# Patient Record
Sex: Male | Born: 1970 | Race: White | Hispanic: No | Marital: Married | Smoking: Former smoker
Health system: Southern US, Community
[De-identification: ages and names within clinical notes are randomized; demographics above are authoritative.]

## PROBLEM LIST (undated history)

## (undated) DIAGNOSIS — K449 Diaphragmatic hernia without obstruction or gangrene: Secondary | ICD-10-CM

## (undated) DIAGNOSIS — D649 Anemia, unspecified: Secondary | ICD-10-CM

## (undated) DIAGNOSIS — K219 Gastro-esophageal reflux disease without esophagitis: Secondary | ICD-10-CM

## (undated) HISTORY — DX: Gastro-esophageal reflux disease without esophagitis: K21.9

## (undated) HISTORY — DX: Anemia, unspecified: D64.9

## (undated) HISTORY — DX: Diaphragmatic hernia without obstruction or gangrene: K44.9

---

## 2001-12-27 ENCOUNTER — Emergency Department (HOSPITAL_COMMUNITY): Admission: EM | Admit: 2001-12-27 | Discharge: 2001-12-27 | Payer: Self-pay | Admitting: *Deleted

## 2003-11-04 ENCOUNTER — Encounter: Admission: RE | Admit: 2003-11-04 | Discharge: 2003-11-04 | Payer: Self-pay | Admitting: Family Medicine

## 2005-10-02 IMAGING — US US ABDOMEN COMPLETE
1 series · 14 of 25 positions shown · non-contrast
Comparison: none

CLINICAL DATA: 33 year old with right upper quadrant abdominal pain.
 ULTRASOUND ABDOMEN, COMPLETE
 Liver is sonographically normal in appearance.  The gallbladder is normal.  The common bile duct measures 2.9 mm, within normal limits.  The IVC and aorta are normal.  Pancreas is fairly well visualized and demonstrates no sonographic abnormalities.  Spleen is mildly enlarged, measuring a maximum of 14 cm.  The right kidney measures 11.6 cm, the left kidney measures 12.4 cm.  No hydronephrosis, renal calculi, or renal masses.  Normal echogenicity.  
 IMPRESSION
 1. Unremarkable ultrasound of abdomen examination except for a mildly enlarged spleen.

[Series 1: unknown · 0.32mm/px · 14 of 83 slices shown]
[im 1/83]
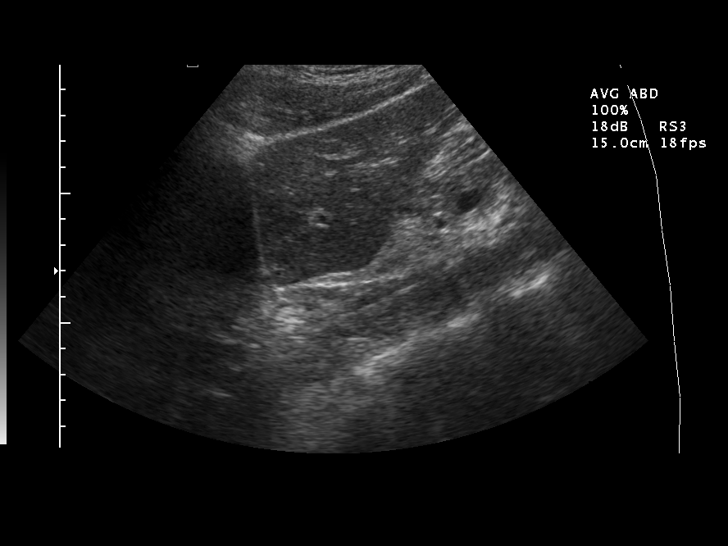
[im 7/83]
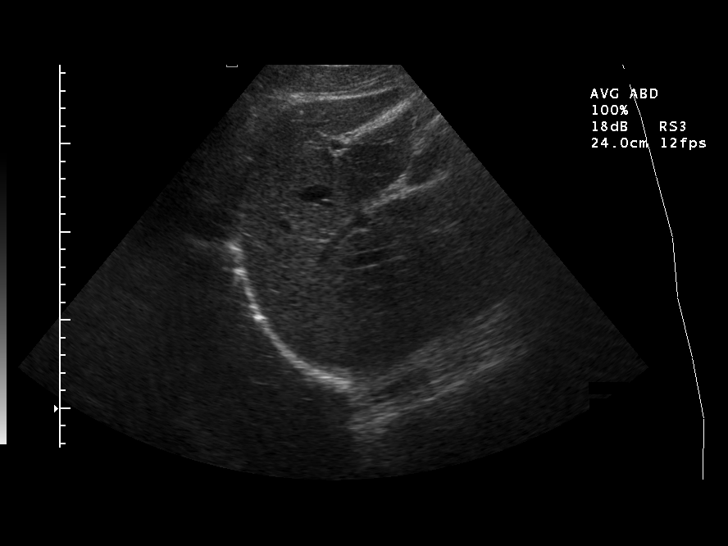
[im 14/83]
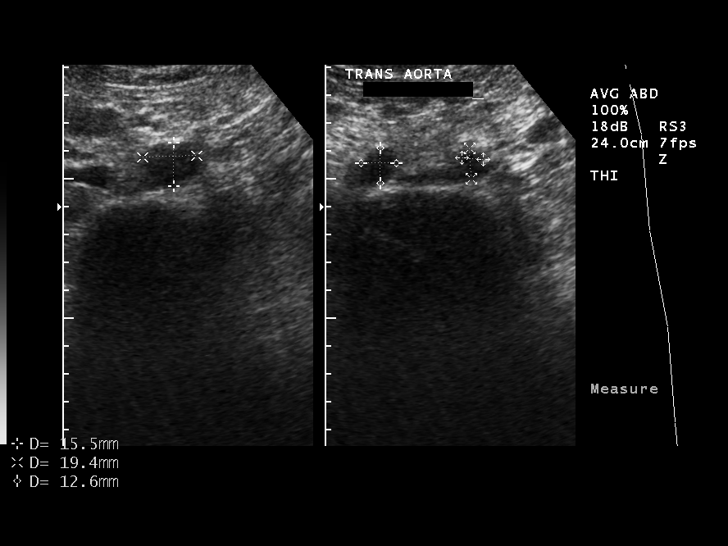
[im 21/83]
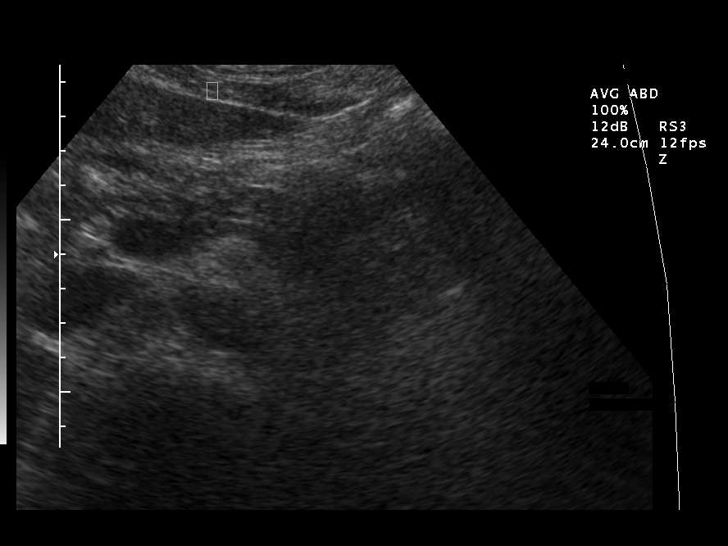
[im 28/83]
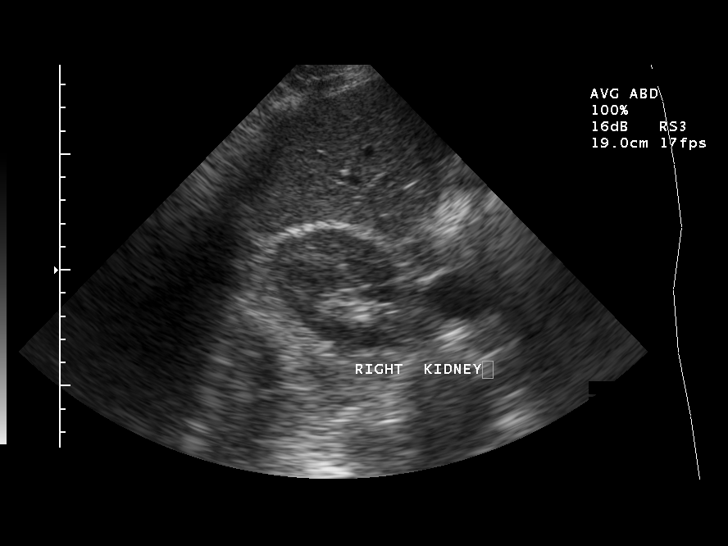
[im 31/83]
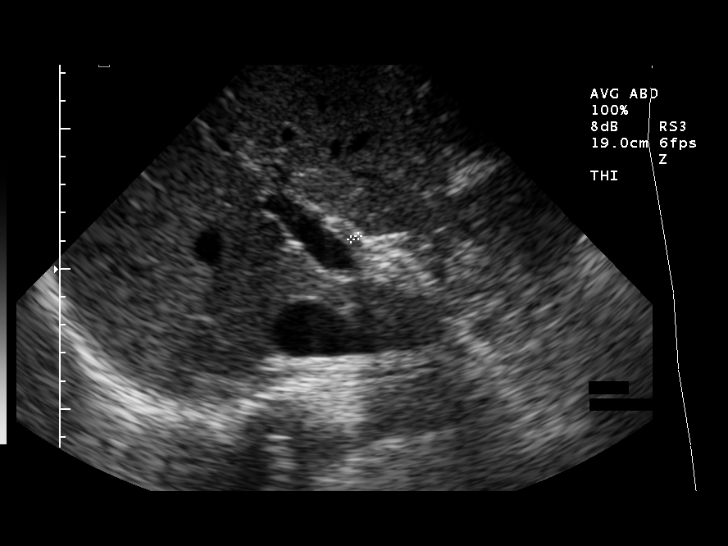
[im 38/83]
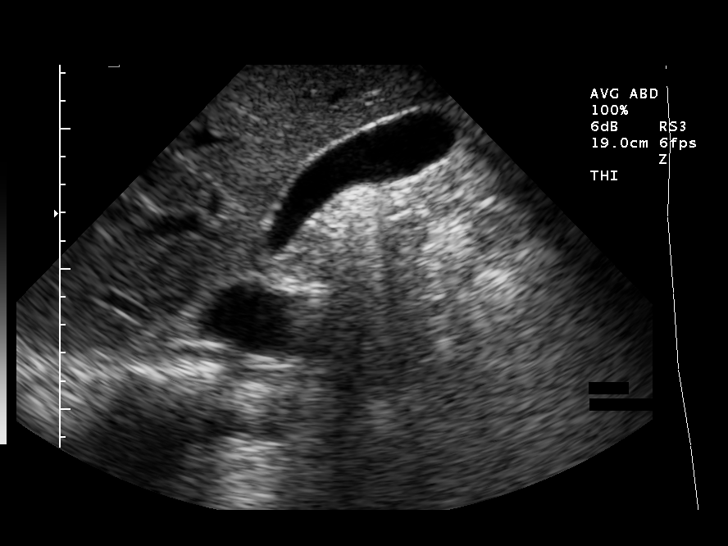
[im 45/83]
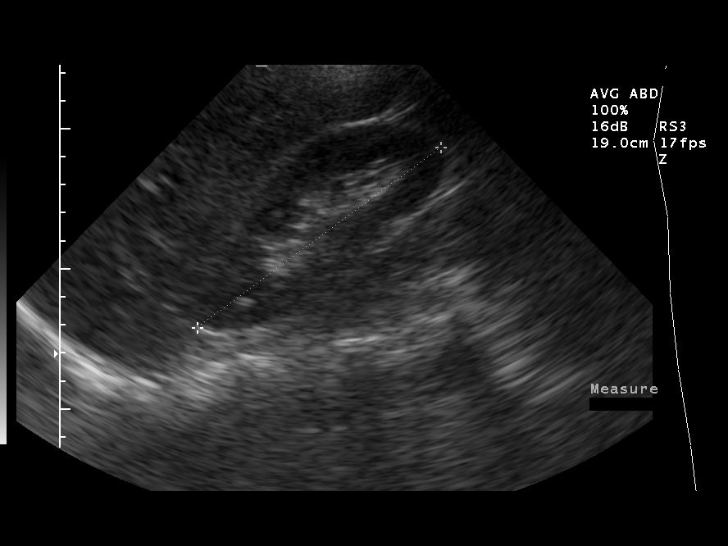
[im 52/83]
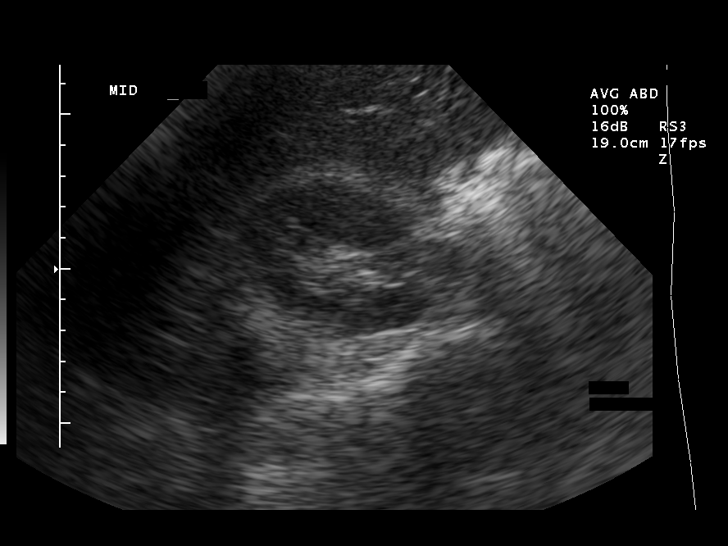
[im 55/83]
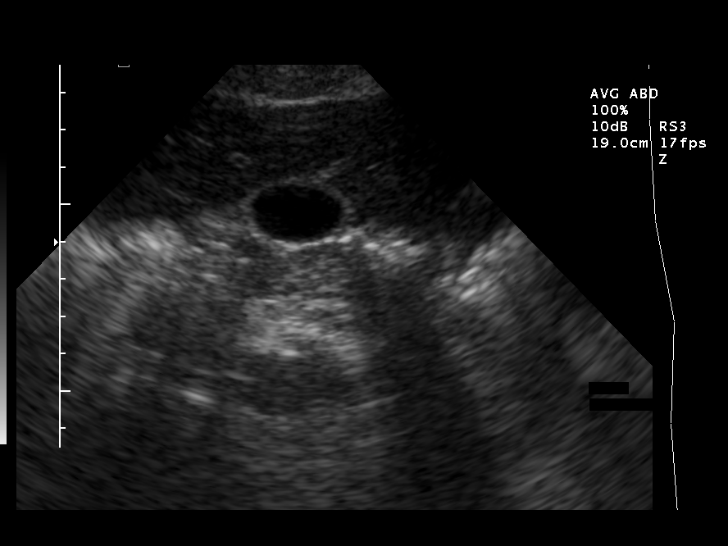
[im 62/83]
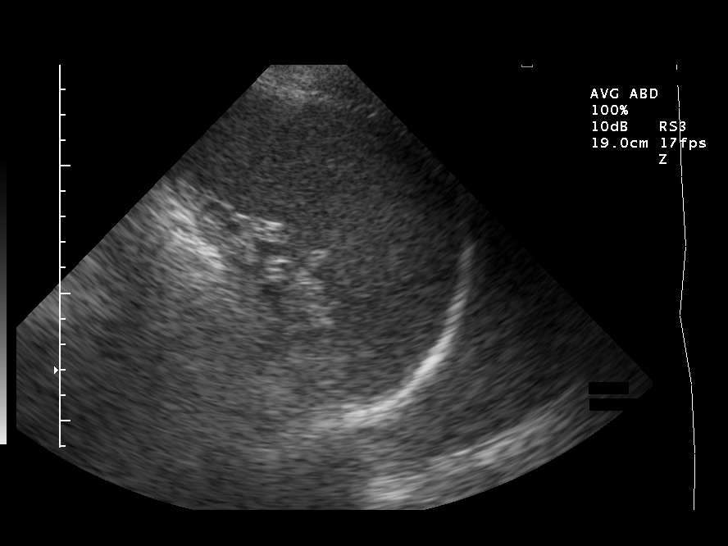
[im 69/83]
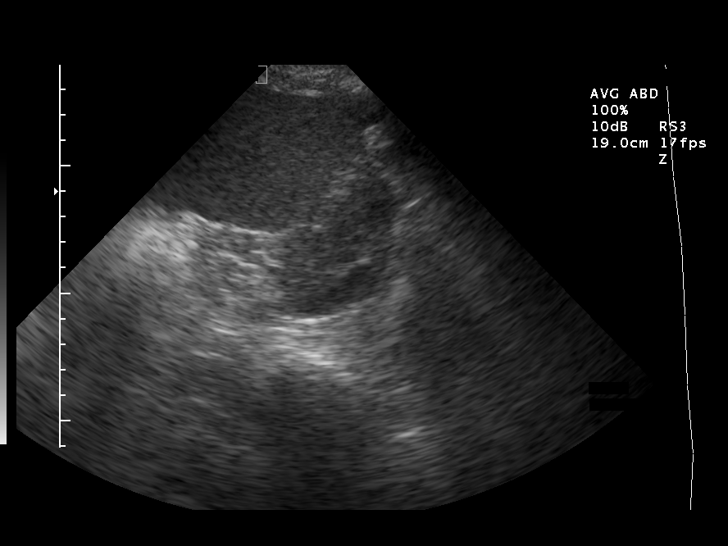
[im 76/83]
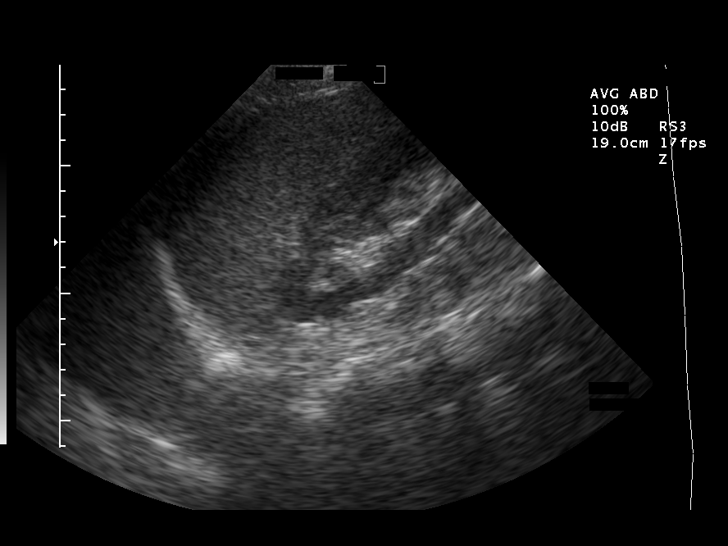
[im 83/83]
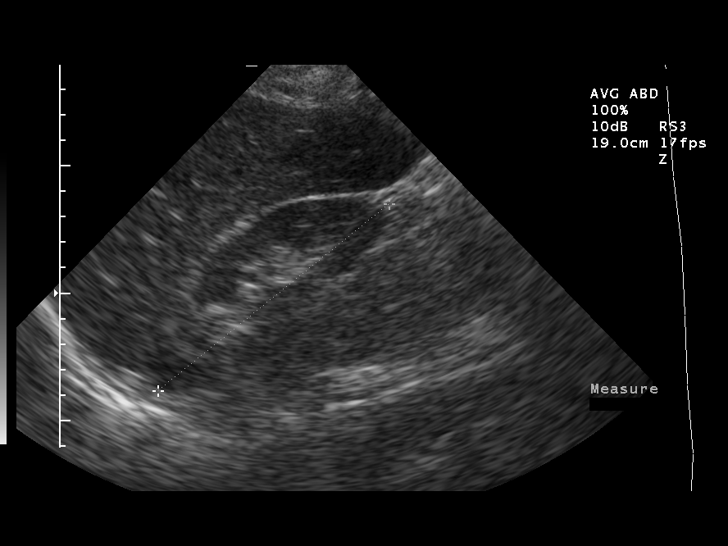

[14 of 25 positions shown; findings below may reference images not displayed]

## 2005-11-13 ENCOUNTER — Ambulatory Visit: Payer: Self-pay | Admitting: Pulmonary Disease

## 2011-05-22 ENCOUNTER — Encounter: Payer: Self-pay | Admitting: Gastroenterology

## 2011-05-22 ENCOUNTER — Telehealth: Payer: Self-pay | Admitting: Internal Medicine

## 2011-05-22 NOTE — Telephone Encounter (Signed)
Spoke with wife, pt schedule with Jarold Motto 07/02/2011

## 2011-05-22 NOTE — Telephone Encounter (Signed)
Dr Jarold Motto will you accept this patient?

## 2011-05-22 NOTE — Telephone Encounter (Signed)
ok 

## 2011-05-22 NOTE — Telephone Encounter (Signed)
Dr. Gessner do you approve of change? 

## 2011-06-25 ENCOUNTER — Encounter: Payer: Self-pay | Admitting: *Deleted

## 2011-07-02 ENCOUNTER — Encounter: Payer: Self-pay | Admitting: Gastroenterology

## 2011-07-02 ENCOUNTER — Other Ambulatory Visit (INDEPENDENT_AMBULATORY_CARE_PROVIDER_SITE_OTHER): Payer: BC Managed Care – PPO

## 2011-07-02 ENCOUNTER — Ambulatory Visit (INDEPENDENT_AMBULATORY_CARE_PROVIDER_SITE_OTHER): Payer: BC Managed Care – PPO | Admitting: Gastroenterology

## 2011-07-02 VITALS — BP 124/72 | HR 80 | Ht 74.0 in | Wt 225.0 lb

## 2011-07-02 DIAGNOSIS — R197 Diarrhea, unspecified: Secondary | ICD-10-CM

## 2011-07-02 DIAGNOSIS — K625 Hemorrhage of anus and rectum: Secondary | ICD-10-CM

## 2011-07-02 DIAGNOSIS — Z8379 Family history of other diseases of the digestive system: Secondary | ICD-10-CM

## 2011-07-02 DIAGNOSIS — K649 Unspecified hemorrhoids: Secondary | ICD-10-CM

## 2011-07-02 LAB — IBC PANEL
Iron: 55 ug/dL (ref 42–165)
Saturation Ratios: 12.8 % — ABNORMAL LOW (ref 20.0–50.0)
Transferrin: 306 mg/dL (ref 212.0–360.0)

## 2011-07-02 LAB — CBC WITH DIFFERENTIAL/PLATELET
Basophils Relative: 0.2 % (ref 0.0–3.0)
Eosinophils Absolute: 0.1 10*3/uL (ref 0.0–0.7)
Eosinophils Relative: 2 % (ref 0.0–5.0)
Hemoglobin: 16.1 g/dL (ref 13.0–17.0)
Lymphocytes Relative: 15.7 % (ref 12.0–46.0)
MCHC: 34.3 g/dL (ref 30.0–36.0)
MCV: 86.9 fl (ref 78.0–100.0)
Monocytes Absolute: 0.5 10*3/uL (ref 0.1–1.0)
Neutro Abs: 5 10*3/uL (ref 1.4–7.7)
RBC: 5.38 Mil/uL (ref 4.22–5.81)
WBC: 6.7 10*3/uL (ref 4.5–10.5)

## 2011-07-02 LAB — VITAMIN B12: Vitamin B-12: 497 pg/mL (ref 211–911)

## 2011-07-02 LAB — TSH: TSH: 1.31 u[IU]/mL (ref 0.35–5.50)

## 2011-07-02 LAB — BASIC METABOLIC PANEL
Calcium: 9.5 mg/dL (ref 8.4–10.5)
Creatinine, Ser: 1.1 mg/dL (ref 0.4–1.5)
GFR: 80.55 mL/min (ref >60.00)
Sodium: 142 mEq/L (ref 135–145)

## 2011-07-02 LAB — HEPATIC FUNCTION PANEL
Bilirubin, Direct: 0.1 mg/dL (ref 0.0–0.3)
Total Bilirubin: 0.4 mg/dL (ref 0.3–1.2)

## 2011-07-02 LAB — SEDIMENTATION RATE: Sed Rate: 10 mm/hr (ref 0–22)

## 2011-07-02 MED ORDER — MOVIPREP 100 G PO SOLR: 1.0000 | Freq: Once | ORAL | Status: DC

## 2011-07-02 NOTE — Patient Instructions (Signed)
Your procedure has been scheduled for 07/05/2011, please follow the seperate instructions.  Your prescription(s) have been sent to you pharmacy.  Please go to the basement today for your labs.  Follow the FODMAP diet given today.

## 2011-07-02 NOTE — Progress Notes (Signed)
History of Present Illness:  This is a very nice 41 year old Caucasian male self-referred for evaluation of 8 months of periodic watery diarrhea with some bright red blood per rectum and some anorectal discomfort. His diarrhea is periodic without any real precipitating element except for greasy foods. He has an extremely positive family history of celiac disease and his mother, brother, and uncle. Apparently he has been evaluated in [facility] with small bowel biopsy that was normal. Recent serologic studies also were apparently normal. The patient denies lactose intolerance, anorexia, weight loss, or any systemic complaints. He does have seasonal allergies with hayfever-type complaints. He has periodic acid reflux but that does not require medications. The patient denies abdominal pain, hepatobiliary or systemic complaints such as fever or chills.  I have reviewed this patient's present history, medical and surgical past history, allergies and medications.     ROS: The remainder of the 10 point ROS is negative     Physical Exam: Blood pressure 124/72, pulse 80 and regular, and BMI 28.89. General well developed well nourished patient in no acute distress, appearing his stated age Eyes PERRLA, no icterus, fundoscopic exam per opthamologist Skin no lesions noted Neck supple, no adenopathy, no thyroid enlargement, no tenderness Chest clear to percussion and auscultation Heart no significant murmurs, gallops or rubs noted Abdomen no hepatosplenomegaly masses or tenderness, BS normal.  Rectal inspection normal no fissures, or fistulae noted.  No masses or tenderness on digital exam. Stool guaiac negative. Posterior external hemorrhoid noted. It is not thrombosed or bleeding. I could not appreciate any other abnormalities on visualization or digital exam. His stool is loose and has a lot of mucus, but no heme. Extremities no acute joint lesions, edema, phlebitis or evidence of cellulitis. Neurologic  patient oriented x 3, cranial nerves intact, no focal neurologic deficits noted. Psychological mental status normal and normal affect.  Assessment and plan: Periodic diarrhea probably related to IBS with resultant hemorrhoidal bleeding. I have decided to check laboratory parameters including celiac profile, also we will proceed with endoscopy and small bowel biopsy, also colonoscopy to exclude left-sided colitis. I have placed him on the  FODMAP for IBS diarrhea which includes some gluten avoidance. The risk and benefits of these procedures have been explained, and he has agreed to proceed as planned. He does not want creams for his hemorrhoids, or medications for GERD  No diagnosis found.

## 2011-07-03 LAB — CELIAC PANEL 10
Endomysial Screen: NEGATIVE
Gliadin IgA: 6.3 U/mL (ref <20)
Gliadin IgG: 23 U/mL — ABNORMAL HIGH (ref <20)
Tissue Transglut Ab: 345 U/mL — ABNORMAL HIGH (ref <20)
Tissue Transglutaminase Ab, IgA: 3.9 U/mL (ref <20)

## 2011-07-05 ENCOUNTER — Ambulatory Visit (AMBULATORY_SURGERY_CENTER): Payer: BC Managed Care – PPO | Admitting: Gastroenterology

## 2011-07-05 ENCOUNTER — Encounter: Payer: Self-pay | Admitting: Gastroenterology

## 2011-07-05 ENCOUNTER — Other Ambulatory Visit: Payer: Self-pay | Admitting: Gastroenterology

## 2011-07-05 VITALS — BP 129/70 | HR 61 | Temp 97.5°F | Resp 20 | Ht 74.0 in | Wt 225.0 lb

## 2011-07-05 DIAGNOSIS — K649 Unspecified hemorrhoids: Secondary | ICD-10-CM

## 2011-07-05 DIAGNOSIS — K625 Hemorrhage of anus and rectum: Secondary | ICD-10-CM

## 2011-07-05 DIAGNOSIS — K648 Other hemorrhoids: Secondary | ICD-10-CM

## 2011-07-05 DIAGNOSIS — Z8379 Family history of other diseases of the digestive system: Secondary | ICD-10-CM

## 2011-07-05 DIAGNOSIS — K219 Gastro-esophageal reflux disease without esophagitis: Secondary | ICD-10-CM

## 2011-07-05 DIAGNOSIS — D1330 Benign neoplasm of unspecified part of small intestine: Secondary | ICD-10-CM

## 2011-07-05 DIAGNOSIS — R197 Diarrhea, unspecified: Secondary | ICD-10-CM

## 2011-07-05 DIAGNOSIS — K449 Diaphragmatic hernia without obstruction or gangrene: Secondary | ICD-10-CM

## 2011-07-05 MED ORDER — HYDROCORTISONE ACETATE 25 MG RE SUPP: 25.0000 mg | Freq: Every day | RECTAL | Status: DC

## 2011-07-05 MED ORDER — SODIUM CHLORIDE 0.9 % IV SOLN: 500.0000 mL | INTRAVENOUS | Status: DC

## 2011-07-05 NOTE — Patient Instructions (Addendum)

## 2011-07-05 NOTE — Op Note (Signed)
[facility] [address] [place], [place]  [address]  COLONOSCOPY PROCEDURE REPORT  PATIENT:  Eric Lozano, Eric Lozano  MR#:  [id number] BIRTHDATE:  06-Aug-1970, 40 yrs. old  GENDER:  male ENDOSCOPIST:  Vania Rea. Jarold Motto, MD, [facility] REF. BY: PROCEDURE DATE:  07/05/2011 PROCEDURE:  Colonoscopy 87564 ASA CLASS:  Class I INDICATIONS:  rectal bleeding MEDICATIONS:   propofol (Diprivan) 250 mg IV  DESCRIPTION OF PROCEDURE:   After the risks and benefits and of the procedure were explained, informed consent was obtained. Digital rectal exam was performed and revealed no abnormalities. The LB CF-H180AL [id number] endoscope was introduced through the anus and advanced to the cecum, which was identified by both the appendix and ileocecal valve.  The quality of the prep was excellent, using MoviPrep.  The instrument was then slowly withdrawn as the colon was fully examined. <<PROCEDUREIMAGES>>  FINDINGS:  No polyps or cancers were seen.  This was otherwise a normal examination of the colon.  Internal Hemorrhoids were found. Retroflexed views in the rectum revealed internal hemorrhoids. SEE PICTURES  The scope was then withdrawn from the patient and the procedure completed.  COMPLICATIONS:  None ENDOSCOPIC IMPRESSION: 1) No polyps or cancers 2) Otherwise normal examination 3) Internal hemorrhoids RECOMMENDATIONS: 1) Continue current colorectal screening recommendations for "routine risk" patients with a repeat colonoscopy in 10 years. PRN HEMORRHOID CARE.  REPEAT EXAM:  No  ______________________________ Vania Rea. Jarold Motto, MD, [facility]  CC:  n. eSIGNED:   Vania Rea. Eric Lozano at 07/05/2011 02:28 PM  Corliss Marcus, [phone]

## 2011-07-05 NOTE — Op Note (Signed)
[facility] [address] [place], [place]  [address]  ENDOSCOPY PROCEDURE REPORT  PATIENT:  Eric Lozano, Eric Lozano  MR#:  [id number] BIRTHDATE:  December 04, 1970, 40 yrs. old  GENDER:  male  ENDOSCOPIST:  Vania Rea. Jarold Motto, MD, [facility] Referred by:  PROCEDURE DATE:  07/05/2011 PROCEDURE:  EGD with biopsy, 19147 ASA CLASS:  Class I INDICATIONS:  GERD GLUTEN INTOLERANCE.++ FH CELIAC DISEASE.  MEDICATIONS:   There was residual sedation effect present from prior procedure., propofol (Diprivan) 150 mg IV TOPICAL ANESTHETIC:  DESCRIPTION OF PROCEDURE:   After the risks and benefits of the procedure were explained, informed consent was obtained.  The LB GIF-H180 [id number] endoscope was introduced through the mouth and advanced to the second portion of the duodenum.  The instrument was slowly withdrawn as the mucosa was fully examined. <<PROCEDUREIMAGES>>  A hiatal hernia was found. 5 CM PROLAPSING HIATIAL HERNIA NOTED AND FREE REFLUX.SEE PICTURES.  Normal duodenal folds were noted. SI BIOPSIES DONE.  The esophagus and gastroesophageal junction were completely normal in appearance.    Retroflexed views revealed a hiatal hernia.    The scope was then withdrawn from the patient and the procedure completed.  COMPLICATIONS:  None  ENDOSCOPIC IMPRESSION: 1) Hiatal hernia 2) A hiatal hernia 1.CHRONIC GERD AND HIATIAL HERNIA 2.R/O CELIAC MUCOSAL CHANGES RECOMMENDATIONS: 1) Await biopsy results TRIAL OF DEXILANT 60 MG/QAM SAMPLES.  ______________________________ Vania Rea. Jarold Motto, MD, [facility]  CC:  n. eSIGNED:   Vania Rea. Jadea Shiffer at 07/05/2011 02:36 PM  Corliss Marcus, [phone]

## 2011-07-08 ENCOUNTER — Telehealth: Payer: Self-pay | Admitting: *Deleted

## 2011-07-08 NOTE — Telephone Encounter (Signed)
  Follow up Call-  Call back number 07/05/2011  Post procedure Call Back phone  # [phone]  Permission to leave phone message Yes     Patient questions:  Do you have a fever, pain , or abdominal swelling? no Pain Score  0 *  Have you tolerated food without any problems? yes  Have you been able to return to your normal activities? yes  Do you have any questions about your discharge instructions: Diet   no Medications  no Follow up visit  no  Do you have questions or concerns about your Care? no  Actions: * If pain score is 4 or above: No action needed, pain <4.

## 2011-07-12 ENCOUNTER — Encounter: Payer: Self-pay | Admitting: Gastroenterology

## 2011-07-31 ENCOUNTER — Encounter: Payer: Self-pay | Admitting: *Deleted

## 2011-08-06 ENCOUNTER — Ambulatory Visit: Payer: BC Managed Care – PPO | Admitting: Gastroenterology

## 2012-04-28 ENCOUNTER — Ambulatory Visit (INDEPENDENT_AMBULATORY_CARE_PROVIDER_SITE_OTHER): Payer: BC Managed Care – PPO | Admitting: Gastroenterology

## 2012-04-28 ENCOUNTER — Encounter: Payer: Self-pay | Admitting: Gastroenterology

## 2012-04-28 ENCOUNTER — Other Ambulatory Visit (INDEPENDENT_AMBULATORY_CARE_PROVIDER_SITE_OTHER): Payer: BC Managed Care – PPO

## 2012-04-28 VITALS — BP 100/60 | HR 67 | Ht 73.5 in | Wt 211.1 lb

## 2012-04-28 DIAGNOSIS — K900 Celiac disease: Secondary | ICD-10-CM

## 2012-04-28 DIAGNOSIS — D649 Anemia, unspecified: Secondary | ICD-10-CM

## 2012-04-28 DIAGNOSIS — K648 Other hemorrhoids: Secondary | ICD-10-CM

## 2012-04-28 DIAGNOSIS — R110 Nausea: Secondary | ICD-10-CM

## 2012-04-28 DIAGNOSIS — E739 Lactose intolerance, unspecified: Secondary | ICD-10-CM

## 2012-04-28 DIAGNOSIS — K9041 Non-celiac gluten sensitivity: Secondary | ICD-10-CM

## 2012-04-28 DIAGNOSIS — K625 Hemorrhage of anus and rectum: Secondary | ICD-10-CM

## 2012-04-28 LAB — FOLATE: Folate: 18.6 ng/mL (ref >5.9)

## 2012-04-28 LAB — IBC PANEL: Saturation Ratios: 34.7 % (ref 20.0–50.0)

## 2012-04-28 LAB — FERRITIN: Ferritin: 19.5 ng/mL — ABNORMAL LOW (ref 22.0–322.0)

## 2012-04-28 MED ORDER — HYDROCORTISONE ACETATE 25 MG RE SUPP: RECTAL | Status: AC

## 2012-04-28 NOTE — Progress Notes (Signed)
History of Present Illness:  This is a 42 year old Caucasian male who presents with one-week history of intermittent bright red blood per rectum described originally as maroonish stool.  He had negative colonoscopy in April 2013, and also had a negative endoscopy except for a 5 cm hiatal hernia.  The patient denies acid reflux symptoms at this time.  Prominent internal hemorrhoids were noted the time of his colonoscopy.  Patient's had no abdominal pain associated with his rectal bleeding, but he does perform extreme Frisbee exercise.  He denies current melena, hematochezia, abdominal pain or other GI or systemic complaints.  He does have celiac disease and avoids is a gluten-free diet.  However, previous small bowel biopsy did not confirm actual celiac disease in terms of pathologic changes.  He also has lactose intolerance and avoids major lactose products.  Despite these complaints, he is maintained a normal weight and generally feels good.  He denies upper gastrointestinal or hepatobiliary complaints.  Famikyhistory is noncontributory.  I have reviewed this patient's present history, medical and surgical past history, allergies and medications.     ROS:   All systems were reviewed and are negative unless otherwise stated in the HPI.    Physical Exam: At pressure over 60, pulse 67 and regular, and weight 211 pounds the BMI of 27.48. General well developed well nourished patient in no acute distress, appearing their stated age Eyes PERRLA, no icterus, fundoscopic exam per opthamologist Skin no lesions noted Neck supple, no adenopathy, no thyroid enlargement, no tenderness Chest clear to percussion and auscultation Heart no significant murmurs, gallops or rubs noted Abdomen no hepatosplenomegaly masses or tenderness, BS normal.  Rectal inspection normal no fissures, or fistulae noted.  No masses or tenderness on digital exam. Stool guaiac negative. Extremities no acute joint lesions, edema,  phlebitis or evidence of cellulitis. Neurologic patient oriented x 3, cranial nerves intact, no focal neurologic deficits noted. Psychological mental status normal and normal affect.  ANOSCOPY: Endoscopic exams completed without difficulty.  There is a large posterior hemorrhoid with some fresh heme noted.  Exam of the rectum otherwise unremarkable.  There is no evidence of fissures or fistulae.  Rectal exam showed no masses, tenderness, fluctuance.  Assessment and plan: Probable internal hemorrhoidal bleeding versus episode of ischemic colitis which is resolved related to his extreme Frisbee exercise.  For now, we will treat with Anusol-HC suppositories at bedtime with progress report in 2 weeks.  He still having problems we will proceed with endoscopic management of his hemorrhoids with either sclerosing or rubber band ligation.  He is to follow his gluten-free diet is tolerated.  We'll repeat today his celiac profile.  He does have relatives with documented celiac disease.  I've asked him to avoid NSAIDs if at all possible Encounter Diagnoses  Name Primary?  . Nausea Yes  . Anemia

## 2012-04-28 NOTE — Patient Instructions (Addendum)
We have sent the following medications to your pharmacy for you to pick up at your convenience: Anusol Suppositories, please use at bedtime for two weeks.  Your physician has requested that you go to the basement for the following lab work before leaving today: Anemia Panel and Celiac  _____________________________________________________________________________________________________________________  Hemorrhoids Hemorrhoids are veins in the rectum that get big. These veins can get blocked. Blocked veins become puffy (swollen) and painful. HOME CARE  Eat more fiber.  Drink enough fluid to keep your pee (urine) clear or pale yellow.  Exercise often.  Avoid straining to poop (bowel movement).  Keep the butt area dry and clean.  Only take medicine as told by your doctor. If your hemorrhoids are puffy and painful:  Take a warm bath for 20 to 30 minutes. Do this 3 to 4 times a day.  Place ice packs on the area. Use the ice packs between the baths.  Put ice in a plastic bag.  Place a towel between your skin and the bag.  Leave the ice on for 15 to 20 minutes, 3 to 4 times a day.  Do not use a donut-shaped pillow. Do not sit on the toilet for a long time.  Go to the bathroom when your body has the urge to poop. This is so you do not strain as much to poop. GET HELP RIGHT AWAY IF:   You have increasing pain that is not controlled with medicine.  You have uncontrolled bleeding.  You cannot poop.  You have pain or puffiness outside the area of the hemorrhoids.  You have chills.  You have a temperature by mouth above 102 F (38.9 C), not controlled by medicine. MAKE SURE YOU:   Understand these instructions.  Will watch your condition.  Will get help right away if you are not doing well or get worse. Document Released: 12/26/2007 Document Revised: 06/10/2011 Document Reviewed: 12/26/2007 [facility] Patient Information 2013 [place], [place].

## 2012-04-29 LAB — CELIAC PANEL 10
Gliadin IgA: 5.3 U/mL (ref <20)
Gliadin IgG: 18.5 U/mL (ref <20)
Tissue Transglutaminase Ab, IgA: 3.6 U/mL (ref <20)

## 2013-05-19 ENCOUNTER — Telehealth: Payer: Self-pay | Admitting: Gastroenterology

## 2013-05-19 NOTE — Telephone Encounter (Signed)
Spoke with patient and he states he would like a letter written for tax purposes stating he has celiac disease. Please, advise if this is ok.

## 2013-05-20 ENCOUNTER — Encounter: Payer: Self-pay | Admitting: *Deleted

## 2013-05-20 NOTE — Telephone Encounter (Signed)
I have never seen this pt-will have to review record

## 2013-05-20 NOTE — Telephone Encounter (Signed)
I think it is fine to write a short letter  Stating pt carries a dx of celiac disease, and had been evaluated by Dr. Sharlett Iles- forward to [facility] box please.

## 2013-05-20 NOTE — Telephone Encounter (Signed)
Letter mailed to patient. Patient aware.

## 2017-08-27 NOTE — Progress Notes (Signed)
[facility] [address] [place], [place] [address] Phone: [phone] Subjective:    I'm seeing this patient by the request  of:  Donald Prose, MD   CC: foot pain   [id number]  Eric Lozano is a 47 y.o. male coming in with complaint of foot pain. Bilateral achilles pain. Right is worse. Wakes him up at around 2am. States he has to sleep on his back. Has been stretching but it doesn't help. Feels good when he's active. Most pain is at night. Stiff in the morning. Has insoles in his shoes for his achilles.   Onset- Chronic Location- Achilles  Duration- Night is worse  Character- Sharp Aggravating factors- Sleeping on his side Reliving factors-  Therapies tried- Ice, heat Severity-7 out of 10     Past Medical History:  Diagnosis Date  . Anemia    As a child   . GERD (gastroesophageal reflux disease)   . Hemorrhoids   . Hiatal hernia    No past surgical history on file. Social History   Socioeconomic History  . Marital status: Single    Spouse name: Not on file  . Number of children: 3  . Years of education: Not on file  . Highest education level: Not on file  Occupational History  . Occupation: Product manager: [employer]  . Financial resource strain: Not on file  . Food insecurity:    Worry: Not on file    Inability: Not on file  . Transportation needs:    Medical: Not on file    Non-medical: Not on file  Tobacco Use  . Smoking status: Former Research scientist (life sciences)  . Smokeless tobacco: Never Used  Substance and Sexual Activity  . Alcohol use: Yes    Comment: Occasional   . Drug use: No  . Sexual activity: Not on file  Lifestyle  . Physical activity:    Days per week: Not on file    Minutes per session: Not on file  . Stress: Not on file  Relationships  . Social connections:    Talks on phone: Not on file    Gets together: Not on file    Attends religious service: Not on file    Active member of  club or organization: Not on file    Attends meetings of clubs or organizations: Not on file    Relationship status: Not on file  Other Topics Concern  . Not on file  Social History Narrative  . Not on file   No Known Allergies Family History  Problem Relation Age of Onset  . Celiac disease Mother   . Celiac disease Brother   . Colon cancer Neg Hx   . Leukemia Cousin   . Lymphoma Cousin   . Addison's disease Brother   . Throat cancer Father        ? Esophagus Cancer as well   . Diabetes Maternal Uncle   . Diabetes Paternal Grandfather      Past medical history, social, surgical and family history all reviewed in electronic medical record.  No pertanent information unless stated regarding to the chief complaint.   Review of Systems:Review of systems updated and as accurate as of 08/27/17  No headache, visual changes, nausea, vomiting, diarrhea, constipation, dizziness, abdominal pain, skin rash, fevers, chills, night sweats, weight loss, swollen lymph nodes, body aches, joint swelling, muscle aches, chest pain, shortness of breath, mood changes.   Objective  There  were no vitals taken for this visit. Systems examined below as of 08/27/17   General: No apparent distress alert and oriented x3 mood and affect normal, dressed appropriately.  HEENT: Pupils equal, extraocular movements intact  Respiratory: Patient's speak in full sentences and does not appear short of breath  Cardiovascular: No lower extremity edema, non tender, no erythema  Skin: Warm dry intact with no signs of infection or rash on extremities or on axial skeleton.  Abdomen: Soft nontender  Neuro: Cranial nerves II through XII are intact, neurovascularly intact in all extremities with 2+ DTRs and 2+ pulses.  Lymph: No lymphadenopathy of posterior or anterior cervical chain or axillae bilaterally.  Gait normal with good balance and coordination.  MSK:  Non tender with full range of motion and good stability and  symmetric strength and tone of shoulders, elbows, wrist, hip, knee bilaterally.  Ankle: Bilateral No visible erythema or swelling. Range of motion is full in all directions. Strength is 5/5 in all directions. Stable lateral and medial ligaments; squeeze test and kleiger test unremarkable; Talar dome nontender; No pain at base of 5th MT; No tenderness over cuboid; No tenderness over N spot or navicular prominence No tenderness on posterior aspects of lateral and medial malleolus No sign of peroneal tendon subluxations or tenderness to palpation Tightness of the Achilles at the insertion. Negative tarsal tunnel tinel's Able to walk 4 steps.  MSK US performed of: Bilateral ankle This study was ordered, performed, and interpreted by Charlann Boxer D.O.  Foot/Ankle:   Patient does have some calcific changes at the insertion of the Achilles.  No hypoechoic changes on ultrasound.  IMPRESSION: Calcific Achilles  [address]7110; 15 additional minutes spent for Therapeutic exercises as stated in above notes.  This included exercises focusing on stretching, strengthening, with significant focus on eccentric aspects.   Long term goals include an improvement in range of motion, strength, endurance as well as avoiding reinjury. Patient's frequency would include in 1-2 times a day, 3-5 times a week for a duration of 6-12 weeks. Ankle strengthening that included:  Basic range of motion exercises to allow proper full motion at ankle Stretching of the lower leg and hamstrings  Theraband exercises for the lower leg - inversion, eversion, dorsiflexion and plantarflexion each to be completed with a theraband Balance exercises to increase proprioception Weight bearing exercises to increase strength and balance  Proper technique shown and discussed handout in great detail with ATC.  All questions were discussed and answered.     Impression and Recommendations:     This case required medical decision making of  moderate complexity.      Note: This dictation was prepared with Dragon dictation along with smaller phrase technology. Any transcriptional errors that result from this process are unintentional.

## 2017-08-28 ENCOUNTER — Encounter: Payer: Self-pay | Admitting: Family Medicine

## 2017-08-28 ENCOUNTER — Ambulatory Visit: Payer: BC Managed Care – PPO | Admitting: Family Medicine

## 2017-08-28 VITALS — BP 102/82 | HR 62 | Ht 73.0 in | Wt 217.0 lb

## 2017-08-28 DIAGNOSIS — M6528 Calcific tendinitis, other site: Secondary | ICD-10-CM

## 2017-08-28 DIAGNOSIS — M7660 Achilles tendinitis, unspecified leg: Secondary | ICD-10-CM | POA: Diagnosis not present

## 2017-08-28 MED ORDER — VITAMIN D (ERGOCALCIFEROL) 1.25 MG (50000 UNIT) PO CAPS: 50000.0000 [IU] | ORAL_CAPSULE | ORAL | 0 refills | Status: DC

## 2017-08-28 MED ORDER — DICLOFENAC SODIUM 2 % TD SOLN: 2.0000 g | Freq: Two times a day (BID) | TRANSDERMAL | 3 refills | Status: AC

## 2017-08-28 NOTE — Patient Instructions (Signed)
Good to see you.  Ice 20 minutes 2 times daily. Usually after activity and before bed. Heel lift ([email or web address]) pennsaid pinkie amount topically 2 times daily as needed.  Exercises 3 times a week.  Once weekly vitamin D for 12 weeks.  Iron 65mg  daily with 500mg  of vitamin C or just the vitamin C with iron containing meals Tart cherry extract any dose at night See me again in 3-4 weeks and if not better lets consider labs

## 2017-08-28 NOTE — Assessment & Plan Note (Signed)
Renal tightness noted.  Possible low vitamin D secondary to stated.  Home exercises given.  Discussed heel lift, home exercises.  Patient will follow-up again in 4 weeks

## 2018-08-19 NOTE — Progress Notes (Signed)
[facility] [address] [place], [place] [address] Phone: [phone] Subjective:   Eric Lozano, am serving as a scribe for Dr. Hulan Saas.   CC: Left shoulder pain  [id number]  Eric Lozano is a 48 y.o. male coming in with complaint of L shoulder pain x 4 months.  Pt has been having nagging L shoulder pain off and on pain and did some self-rehab but flared it up again when attempting to block a shot while playing basketball.  Pt states that his L shoulder "gave out."  Ever since that episode, pt states that he has pain with overhead activity to include basketball free throws and doing a "hammer" throw while playing frisbee.  Pt states that laying on his L side is painful and that he can't sleep on that side due to pain.  Functional IR is also problematic.  Pt denies any mechanical symptoms in his L shoulder.  Onset- 4 months Location- L shoulder, superior aspect Duration-  Character- nagging pain Aggravating factors- overhead ROM and activity to include shooting basketball free throws and doing a hammer throw while playing frisbee. Reliving factors-  Therapies tried-  Severity-sometimes 8 out of 10  January he was playing basketball when another player jammed his shoulder into the joint with flexion. Did not feel a pop but did notice pain immediatly. Patient has had pain with [employer]. Unable to sleep on that side. Denies any radiating symptoms. Has pain with hammer throw with ultimate frisbee.   Past Medical History:  Diagnosis Date  . Anemia    As a child   . GERD (gastroesophageal reflux disease)   . Hemorrhoids   . Hiatal hernia    Lozano past surgical history on file. Social History   Socioeconomic History  . Marital status: Single    Spouse name: Not on file  . Number of children: 3  . Years of education: Not on file  . Highest education level: Not on file  Occupational History  . Occupation: Product manager:  [employer]  . Financial resource strain: Not on file  . Food insecurity:    Worry: Not on file    Inability: Not on file  . Transportation needs:    Medical: Not on file    Non-medical: Not on file  Tobacco Use  . Smoking status: Former Research scientist (life sciences)  . Smokeless tobacco: Never Used  Substance and Sexual Activity  . Alcohol use: Yes    Comment: Occasional   . Drug use: Lozano  . Sexual activity: Not on file  Lifestyle  . Physical activity:    Days per week: Not on file    Minutes per session: Not on file  . Stress: Not on file  Relationships  . Social connections:    Talks on phone: Not on file    Gets together: Not on file    Attends religious service: Not on file    Active member of club or organization: Not on file    Attends meetings of clubs or organizations: Not on file    Relationship status: Not on file  Other Topics Concern  . Not on file  Social History Narrative  . Not on file   Lozano Known Allergies Family History  Problem Relation Age of Onset  . Celiac disease Mother   . Celiac disease Brother   . Colon cancer Neg Hx   . Leukemia Cousin   . Lymphoma  Cousin   . Addison's disease Brother   . Throat cancer Father        ? Esophagus Cancer as well   . Diabetes Maternal Uncle   . Diabetes Paternal Grandfather      Current Outpatient Medications (Cardiovascular):  .  nitroGLYCERIN (NITRODUR - DOSED IN MG/24 HR) 0.2 mg/hr patch, 1/4 patch daily     Current Outpatient Medications (Other):  Marland Kitchen  Diclofenac Sodium (PENNSAID) 2 % SOLN, Place 2 g onto the skin 2 (two) times daily. .  hydrocortisone (ANUSOL-HC) 25 MG suppository, Please place one suppositories rectally at bedtime for two weeks .  Multiple Vitamins-Minerals (MENS MULTIVITAMIN PLUS PO), Take 1 tablet by mouth daily. .  Vitamin D, Ergocalciferol, (DRISDOL) 1.25 MG (50000 UT) CAPS capsule, Take 1 capsule (50,000 Units total) by mouth every 7 (seven) days. .  Vitamin D,  Ergocalciferol, (DRISDOL) 1.25 MG (50000 UT) CAPS capsule, Take 1 capsule (50,000 Units total) by mouth every 7 (seven) days.    Past medical history, social, surgical and family history all reviewed in electronic medical record.  Lozano pertanent information unless stated regarding to the chief complaint.   Review of Systems:  Lozano headache, visual changes, nausea, vomiting, diarrhea, constipation, dizziness, abdominal pain, skin rash, fevers, chills, night sweats, weight loss, swollen lymph nodes, body aches, joint swelling,  chest pain, shortness of breath, mood changes.  Positive muscle aches  Objective  Blood pressure 128/88, pulse 67, height 6\' 1"  (1.854 m), weight 217 lb (98.4 kg), SpO2 99 %.    General: Lozano apparent distress alert and oriented x3 mood and affect normal, dressed appropriately.  HEENT: Pupils equal, extraocular movements intact  Respiratory: Patient's speak in full sentences and does not appear short of breath  Cardiovascular: Lozano lower extremity edema, non tender, Lozano erythema  Skin: Warm dry intact with Lozano signs of infection or rash on extremities or on axial skeleton.  Abdomen: Soft nontender  Neuro: Cranial nerves II through XII are intact, neurovascularly intact in all extremities with 2+ DTRs and 2+ pulses.  Lymph: Lozano lymphadenopathy of posterior or anterior cervical chain or axillae bilaterally.  Gait normal with good balance and coordination.  MSK:  Non tender with full range of motion and good stability and symmetric strength and tone of  elbows, wrist, hip, knee and ankles bilaterally.  Shoulder: left Inspection reveals Lozano abnormalities, atrophy or asymmetry. Palpation is normal with Lozano tenderness over AC joint or bicipital groove. ROM is full in all planes passively. Rotator cuff strength normal throughout. signs of impingement with positive Neer and Hawkin's tests, but negative empty can sign. Speeds and Yergason's tests normal. Lozano labral pathology noted with  negative Obrien's, negative clunk and good stability. Normal scapular function observed. Lozano painful arc and Lozano drop arm sign. Lozano apprehension sign Contralateral shoulder unremarkable  MSK US performed of: left This study was ordered, performed, and interpreted by Charlann Boxer D.O.  Shoulder:   Supraspinatus:  Appears normal on long and transverse views does have some mild calcific changes, Bursal bulge seen with shoulder abduction on impingement view.  Calcific changes noted. Infraspinatus:  Appears normal on long and transverse views. Significant increase in Doppler flow Subscapularis:  Appears normal on long and transverse views. Positive bursa Teres Minor:  Appears normal on long and transverse views. AC joint:  Capsule undistended, Lozano geyser sign. Glenohumeral Joint:  Appears normal without effusion. Glenoid Labrum:  Intact without visualized tears. Biceps Tendon:  Appears normal on long and transverse views,  Lozano fraying of tendon, tendon located in intertubercular groove, Lozano subluxation with shoulder internal or external rotation.  Impression: Subacromial bursitis calcific  97110; 15 additional minutes spent for Therapeutic exercises as stated in above notes.  This included exercises focusing on stretching, strengthening, with significant focus on eccentric aspects.   Long term goals include an improvement in range of motion, strength, endurance as well as avoiding reinjury. Patient's frequency would include in 1-2 times a day, 3-5 times a week for a duration of 6-12 weeks. Shoulder Exercises that included:  Basic scapular stabilization to include adduction and depression of scapula Scaption, focusing on proper movement and good control Internal and External rotation utilizing a theraband, with elbow tucked at side entire time Rows with theraband  Which was given   Proper technique shown and discussed handout in great detail with ATC.  All questions were discussed and answered.      Impression and Recommendations:     This case required medical decision making of moderate complexity. The above documentation has been reviewed and is accurate and complete Lyndal Pulley, DO       Note: This dictation was prepared with Dragon dictation along with smaller phrase technology. Any transcriptional errors that result from this process are unintentional.

## 2018-08-20 ENCOUNTER — Encounter: Payer: Self-pay | Admitting: Family Medicine

## 2018-08-20 ENCOUNTER — Other Ambulatory Visit: Payer: Self-pay

## 2018-08-20 ENCOUNTER — Ambulatory Visit: Payer: Self-pay

## 2018-08-20 ENCOUNTER — Ambulatory Visit (INDEPENDENT_AMBULATORY_CARE_PROVIDER_SITE_OTHER): Payer: BC Managed Care – PPO | Admitting: Family Medicine

## 2018-08-20 VITALS — BP 128/88 | HR 67 | Ht 73.0 in | Wt 217.0 lb

## 2018-08-20 DIAGNOSIS — G8929 Other chronic pain: Secondary | ICD-10-CM | POA: Diagnosis not present

## 2018-08-20 DIAGNOSIS — M25512 Pain in left shoulder: Secondary | ICD-10-CM | POA: Diagnosis not present

## 2018-08-20 DIAGNOSIS — M7530 Calcific tendinitis of unspecified shoulder: Secondary | ICD-10-CM | POA: Diagnosis not present

## 2018-08-20 MED ORDER — VITAMIN D (ERGOCALCIFEROL) 1.25 MG (50000 UNIT) PO CAPS: 50000.0000 [IU] | ORAL_CAPSULE | ORAL | 0 refills | Status: AC

## 2018-08-20 MED ORDER — NITROGLYCERIN 0.2 MG/HR TD PT24: MEDICATED_PATCH | TRANSDERMAL | 1 refills | Status: AC

## 2018-08-20 NOTE — Patient Instructions (Signed)
Good to see you.   Ice 20 minutes 2 times daily. Usually after activity and before bed. Keep hands within peripheral vison  Once weekly vitamin D for 12 weeks.  Nitroglycerin Protocol   Apply 1/4 nitroglycerin patch to affected area daily.  Change position of patch within the affected area every 24 hours.  You may experience a headache during the first 1-2 weeks of using the patch, these should subside.  If you experience headaches after beginning nitroglycerin patch treatment, you may take your preferred over the counter pain reliever.  Another side effect of the nitroglycerin patch is skin irritation or rash related to patch adhesive.  Please notify our office if you develop more severe headaches or rash, and stop the patch.  Tendon healing with nitroglycerin patch may require 12 to 24 weeks depending on the extent of injury.  Men should not use if taking Viagra, Cialis, or Levitra.   Do not use if you have migraines or rosacea.  See me again in 5-6 weeks

## 2018-08-21 ENCOUNTER — Encounter: Payer: Self-pay | Admitting: Family Medicine

## 2018-08-21 DIAGNOSIS — M7530 Calcific tendinitis of unspecified shoulder: Secondary | ICD-10-CM | POA: Insufficient documentation

## 2018-08-21 NOTE — Assessment & Plan Note (Signed)
Calcific bursitis.  Discussed with patient in great length.  Restarted once weekly vitamin D and, topical anti-inflammatories, nitroglycerin patch to help aid in healing.  Patient is in agreement with plan follow-up again in 4 to 6 weeks.  There could be an underlying partial tear of the rotator cuff and will monitor closely with a repeat ultrasound and follow-up

## 2018-09-24 ENCOUNTER — Ambulatory Visit: Payer: BC Managed Care – PPO | Admitting: Family Medicine

## 2018-09-30 ENCOUNTER — Encounter: Payer: Self-pay | Admitting: Family Medicine

## 2018-09-30 ENCOUNTER — Other Ambulatory Visit: Payer: Self-pay

## 2018-09-30 ENCOUNTER — Ambulatory Visit (INDEPENDENT_AMBULATORY_CARE_PROVIDER_SITE_OTHER): Payer: BC Managed Care – PPO | Admitting: Family Medicine

## 2018-09-30 ENCOUNTER — Ambulatory Visit: Payer: Self-pay

## 2018-09-30 VITALS — BP 104/70 | HR 62 | Ht 73.0 in

## 2018-09-30 DIAGNOSIS — M7530 Calcific tendinitis of unspecified shoulder: Secondary | ICD-10-CM | POA: Diagnosis not present

## 2018-09-30 DIAGNOSIS — M25512 Pain in left shoulder: Secondary | ICD-10-CM | POA: Diagnosis not present

## 2018-09-30 DIAGNOSIS — G8929 Other chronic pain: Secondary | ICD-10-CM

## 2018-09-30 DIAGNOSIS — W57XXXA Bitten or stung by nonvenomous insect and other nonvenomous arthropods, initial encounter: Secondary | ICD-10-CM

## 2018-09-30 DIAGNOSIS — S40262A Insect bite (nonvenomous) of left shoulder, initial encounter: Secondary | ICD-10-CM | POA: Diagnosis not present

## 2018-09-30 MED ORDER — DOXYCYCLINE HYCLATE 100 MG PO TABS: 100.0000 mg | ORAL_TABLET | Freq: Two times a day (BID) | ORAL | 0 refills | Status: AC

## 2018-09-30 NOTE — Assessment & Plan Note (Signed)
Patient did not make any significant improvement.  Injection given today.  Encouraged him to continue the nitroglycerin for now.  Discussed icing regimen, topical anti-inflammatories and icing regimen.  Follow-up with me again in 6 weeks

## 2018-09-30 NOTE — Assessment & Plan Note (Signed)
Tick bite removed  Doxy given  RTC  if redness.

## 2018-09-30 NOTE — Progress Notes (Signed)
[name] [place], [place] [address] Phone: [phone] Subjective:   Eric Lozano, am serving as a scribe for Dr. Hulan Saas.   CC: shoulder pain follow up   [id number]   08/11/2018: Calcific bursitis.  Discussed with patient in great length.  Restarted once weekly vitamin D and, topical anti-inflammatories, nitroglycerin patch to help aid in healing.  Patient is in agreement with plan follow-up again in 4 to 6 weeks.  There could be an underlying partial tear of the rotator cuff and will monitor closely with a repeat ultrasound and follow-up Update 09/30/2018: Eric Lozano is a 48 y.o. male coming in with complaint of left shoulder pain. Patient was camping and tubing last week and swimming in the water using his arms has not had improvement. Had not been using arm otherwise other than to do rehab. Does mention playing ultimate but with modifications. Has been using nitro patches daily.      Past Medical History:  Diagnosis Date  . Anemia    As a child   . GERD (gastroesophageal reflux disease)   . Hemorrhoids   . Hiatal hernia    Lozano past surgical history on file. Social History   Socioeconomic History  . Marital status: Single    Spouse name: Not on file  . Number of children: 3  . Years of education: Not on file  . Highest education level: Not on file  Occupational History  . Occupation: Product manager: [employer]  . Financial resource strain: Not on file  . Food insecurity    Worry: Not on file    Inability: Not on file  . Transportation needs    Medical: Not on file    Non-medical: Not on file  Tobacco Use  . Smoking status: Former Research scientist (life sciences)  . Smokeless tobacco: Never Used  Substance and Sexual Activity  . Alcohol use: Yes    Comment: Occasional   . Drug use: Lozano  . Sexual activity: Not on file  Lifestyle  . Physical activity    Days per week: Not on file    Minutes per  session: Not on file  . Stress: Not on file  Relationships  . Social Herbalist on phone: Not on file    Gets together: Not on file    Attends religious service: Not on file    Active member of club or organization: Not on file    Attends meetings of clubs or organizations: Not on file    Relationship status: Not on file  Other Topics Concern  . Not on file  Social History Narrative  . Not on file   Lozano Known Allergies Family History  Problem Relation Age of Onset  . Celiac disease Mother   . Celiac disease Brother   . Colon cancer Neg Hx   . Leukemia Cousin   . Lymphoma Cousin   . Addison's disease Brother   . Throat cancer Father        ? Esophagus Cancer as well   . Diabetes Maternal Uncle   . Diabetes Paternal Grandfather      Current Outpatient Medications (Cardiovascular):  .  nitroGLYCERIN (NITRODUR - DOSED IN MG/24 HR) 0.2 mg/hr patch, 1/4 patch daily     Current Outpatient Medications (Other):  Marland Kitchen  Diclofenac Sodium (PENNSAID) 2 % SOLN, Place 2 g onto the skin 2 (two) times daily. Marland Kitchen  doxycycline (  VIBRA-TABS) 100 MG tablet, Take 1 tablet (100 mg total) by mouth 2 (two) times daily. Marland Kitchen  doxycycline (VIBRA-TABS) 100 MG tablet, Take 1 tablet (100 mg total) by mouth 2 (two) times daily. .  hydrocortisone (ANUSOL-HC) 25 MG suppository, Please place one suppositories rectally at bedtime for two weeks .  Multiple Vitamins-Minerals (MENS MULTIVITAMIN PLUS PO), Take 1 tablet by mouth daily. .  Vitamin D, Ergocalciferol, (DRISDOL) 1.25 MG (50000 UT) CAPS capsule, Take 1 capsule (50,000 Units total) by mouth every 7 (seven) days. .  Vitamin D, Ergocalciferol, (DRISDOL) 1.25 MG (50000 UT) CAPS capsule, Take 1 capsule (50,000 Units total) by mouth every 7 (seven) days.    Past medical history, social, surgical and family history all reviewed in electronic medical record.  Lozano pertanent information unless stated regarding to the chief complaint.   Review of Systems:   Lozano headache, visual changes, nausea, vomiting, diarrhea, constipation, dizziness, abdominal pain, skin rash, fevers, chills, night sweats, weight loss, swollen lymph nodes, body aches, joint swelling, , chest pain, shortness of breath, mood changes.  Positive muscle aches  Objective  Blood pressure 104/70, pulse 62, height 6\' 1"  (1.854 m), SpO2 98 %.    General: Lozano apparent distress alert and oriented x3 mood and affect normal, dressed appropriately.  HEENT: Pupils equal, extraocular movements intact  Respiratory: Patient's speak in full sentences and does not appear short of breath  Cardiovascular: Lozano lower extremity edema, non tender, Lozano erythema  Skin: Warm dry intact with Lozano signs of infection or rash on extremities or on axial skeleton.  Abdomen: Soft nontender  Neuro: Cranial nerves II through XII are intact, neurovascularly intact in all extremities with 2+ DTRs and 2+ pulses.  Lymph: Lozano lymphadenopathy of posterior or anterior cervical chain or axillae bilaterally.  Gait normal with good balance and coordination.  MSK:  Non tender with full range of motion and good stability and symmetric strength and tone of  elbows, wrist, hip, knee and ankles bilaterally.  Shoulder: Left shoulder Inspection reveals take is on the superior lateral aspect of the deltoid Palpation tender over the deltoid area ROM is full in all planes. Rotator cuff strength normal throughout. Positive impingement Speeds and Yergason's tests normal. Lozano labral pathology noted with negative Obrien's, negative clunk and good stability.  Mild positive crossover Normal scapular function observed. Lozano painful arc and Lozano drop arm sign. Lozano apprehension sign Contralateral shoulder unremarkable  With tweezers tick was removed.  Lozano blood loss.  Full part of the tick was removed.  All removed in 1 piece  Procedure: Real-time Ultrasound Guided Injection of left glenohumeral joint Device: GE Logiq E  Ultrasound guided  injection is preferred based studies that show increased duration, increased effect, greater accuracy, decreased procedural pain, increased response rate with ultrasound guided versus blind injection.  Verbal informed consent obtained.  Time-out conducted.  Noted Lozano overlying erythema, induration, or other signs of local infection.  Skin prepped in a sterile fashion.  Local anesthesia: Topical Ethyl chloride.  With sterile technique and under real time ultrasound guidance:  Joint visualized.  21g 2 inch needle inserted posterior approach. Pictures taken for needle placement. Patient did have injection of 2 cc of 0.5% Marcaine, and 1cc of Kenalog 40 mg/dL. Completed without difficulty  Pain immediately resolved suggesting accurate placement of the medication.  Advised to call if fevers/chills, erythema, induration, drainage, or persistent bleeding.  Images permanently stored and available for review in the ultrasound unit.  Impression: Technically successful ultrasound guided  injection.   Impression and Recommendations:     This case required medical decision making of moderate complexity. The above documentation has been reviewed and is accurate and complete Lyndal Pulley, DO       Note: This dictation was prepared with Dragon dictation along with smaller phrase technology. Any transcriptional errors that result from this process are unintentional.

## 2018-09-30 NOTE — Patient Instructions (Signed)
Continue the nitro Continue exercises See me again in 1-2 months

## 2018-10-28 ENCOUNTER — Ambulatory Visit (INDEPENDENT_AMBULATORY_CARE_PROVIDER_SITE_OTHER): Payer: BC Managed Care – PPO | Admitting: Family Medicine

## 2018-10-28 ENCOUNTER — Other Ambulatory Visit: Payer: Self-pay

## 2018-10-28 ENCOUNTER — Encounter: Payer: Self-pay | Admitting: Family Medicine

## 2018-10-28 ENCOUNTER — Ambulatory Visit: Payer: Self-pay

## 2018-10-28 VITALS — BP 120/86 | HR 76 | Ht 73.0 in | Wt 229.0 lb

## 2018-10-28 DIAGNOSIS — M25512 Pain in left shoulder: Secondary | ICD-10-CM

## 2018-10-28 DIAGNOSIS — G8929 Other chronic pain: Secondary | ICD-10-CM

## 2018-10-28 DIAGNOSIS — M7530 Calcific tendinitis of unspecified shoulder: Secondary | ICD-10-CM | POA: Diagnosis not present

## 2018-10-28 MED ORDER — VITAMIN D (ERGOCALCIFEROL) 1.25 MG (50000 UNIT) PO CAPS: 50000.0000 [IU] | ORAL_CAPSULE | ORAL | 0 refills | Status: AC

## 2018-10-28 NOTE — Progress Notes (Signed)
[name] [place] [place], [place] [address] Phone: [phone] Subjective:   Eric Lozano, am serving as a scribe for Dr. Hulan Saas.  CC: Shoulder pain follow-up  IHK:[id number]   09/30/2018: Patient did not make any significant improvement.  Injection given today.  Encouraged him to continue the nitroglycerin for now.  Discussed icing regimen, topical anti-inflammatories and icing regimen.  Follow-up with me again in 6 weeks  Update 10/28/2018: Eric Lozano is a 48 y.o. male coming in with complaint of left shoulder pain. Patient states that his shoulder is better but does still bother him. Has soreness after throwing a Frisbee in hammer throw position. Patient is doing overall about 80% better.  Making progress.  Doing the exercises regularly and still lifting on a more regular basis.  Patient denies any radiation of pain, any neck pain associated with     Past Medical History:  Diagnosis Date  . Anemia    As a child   . GERD (gastroesophageal reflux disease)   . Hemorrhoids   . Hiatal hernia    Lozano past surgical history on file. Social History   Socioeconomic History  . Marital status: Single    Spouse name: Not on file  . Number of children: 3  . Years of education: Not on file  . Highest education level: Not on file  Occupational History  . Occupation: Product manager: [employer]  . Financial resource strain: Not on file  . Food insecurity    Worry: Not on file    Inability: Not on file  . Transportation needs    Medical: Not on file    Non-medical: Not on file  Tobacco Use  . Smoking status: Former Research scientist (life sciences)  . Smokeless tobacco: Never Used  Substance and Sexual Activity  . Alcohol use: Yes    Comment: Occasional   . Drug use: Lozano  . Sexual activity: Not on file  Lifestyle  . Physical activity    Days per week: Not on file    Minutes per session: Not on file  . Stress: Not on file   Relationships  . Social Herbalist on phone: Not on file    Gets together: Not on file    Attends religious service: Not on file    Active member of club or organization: Not on file    Attends meetings of clubs or organizations: Not on file    Relationship status: Not on file  Other Topics Concern  . Not on file  Social History Narrative  . Not on file   Lozano Known Allergies Family History  Problem Relation Age of Onset  . Celiac disease Mother   . Celiac disease Brother   . Colon cancer Neg Hx   . Leukemia Cousin   . Lymphoma Cousin   . Addison's disease Brother   . Throat cancer Father        ? Esophagus Cancer as well   . Diabetes Maternal Uncle   . Diabetes Paternal Grandfather      Current Outpatient Medications (Cardiovascular):  .  nitroGLYCERIN (NITRODUR - DOSED IN MG/24 HR) 0.2 mg/hr patch, 1/4 patch daily     Current Outpatient Medications (Other):  Marland Kitchen  Diclofenac Sodium (PENNSAID) 2 % SOLN, Place 2 g onto the skin 2 (two) times daily. Marland Kitchen  doxycycline (VIBRA-TABS) 100 MG tablet, Take 1 tablet (100 mg total) by mouth 2 (  two) times daily. Marland Kitchen  doxycycline (VIBRA-TABS) 100 MG tablet, Take 1 tablet (100 mg total) by mouth 2 (two) times daily. .  hydrocortisone (ANUSOL-HC) 25 MG suppository, Please place one suppositories rectally at bedtime for two weeks .  Multiple Vitamins-Minerals (MENS MULTIVITAMIN PLUS PO), Take 1 tablet by mouth daily. .  Vitamin D, Ergocalciferol, (DRISDOL) 1.25 MG (50000 UT) CAPS capsule, Take 1 capsule (50,000 Units total) by mouth every 7 (seven) days. .  Vitamin D, Ergocalciferol, (DRISDOL) 1.25 MG (50000 UT) CAPS capsule, Take 1 capsule (50,000 Units total) by mouth every 7 (seven) days. .  Vitamin D, Ergocalciferol, (DRISDOL) 1.25 MG (50000 UT) CAPS capsule, Take 1 capsule (50,000 Units total) by mouth every 7 (seven) days.    Past medical history, social, surgical and family history all reviewed in electronic medical record.   Lozano pertanent information unless stated regarding to the chief complaint.   Review of Systems:  Lozano headache, visual changes, nausea, vomiting, diarrhea, constipation, dizziness, abdominal pain, skin rash, fevers, chills, night sweats, weight loss, swollen lymph nodes, body aches, joint swelling,  chest pain, shortness of breath, mood changes.  Positive muscle aches  Objective  Blood pressure 120/86, pulse 76, height 6\' 1"  (1.854 m), weight 229 lb (103.9 kg), SpO2 98 %.    General: Lozano apparent distress alert and oriented x3 mood and affect normal, dressed appropriately.  HEENT: Pupils equal, extraocular movements intact  Respiratory: Patient's speak in full sentences and does not appear short of breath  Cardiovascular: Lozano lower extremity edema, non tender, Lozano erythema  Skin: Warm dry intact with Lozano signs of infection or rash on extremities or on axial skeleton.  Abdomen: Soft nontender  Neuro: Cranial nerves II through XII are intact, neurovascularly intact in all extremities with 2+ DTRs and 2+ pulses.  Lymph: Lozano lymphadenopathy of posterior or anterior cervical chain or axillae bilaterally.  Gait normal with good balance and coordination.  MSK:  Non tender with full range of motion and good stability and symmetric strength and tone of elbows, wrist, hip, knee and ankles bilaterally.  Shoulder: Left Inspection reveals Lozano abnormalities, atrophy or asymmetry. Palpation is normal with Lozano tenderness over AC joint or bicipital groove. ROM is full in all planes. Rotator cuff strength normal throughout. Very minimal impingement with Hawkins but otherwise unremarkable Speeds and Yergason's tests normal. Lozano labral pathology noted with negative Obrien's, negative clunk and good stability. Normal scapular function observed. Lozano painful arc and Lozano drop arm sign. Lozano apprehension sign Contralateral shoulder unremarkable      Impression and Recommendations:      The above documentation has been  reviewed and is accurate and complete Lyndal Pulley, DO       Note: This dictation was prepared with Dragon dictation along with smaller phrase technology. Any transcriptional errors that result from this process are unintentional.

## 2018-10-28 NOTE — Patient Instructions (Signed)
Vit D refilled Continue nitro for 3 weeks then every other day for 2 weeks then discontinue Keep hands within peripheral vision with most lifting See me again in 2 months if not perfect

## 2018-10-28 NOTE — Assessment & Plan Note (Signed)
Patient is doing significantly better at this time.  I would like patient to start to discontinue the nitroglycerin over the course of several weeks.  I discussed proper lifting mechanics.  Encouraged to continue the vitamin D follow-up again in 2 months

## 2018-12-28 ENCOUNTER — Ambulatory Visit: Payer: BC Managed Care – PPO | Admitting: Family Medicine

## 2019-12-21 ENCOUNTER — Other Ambulatory Visit: Payer: BC Managed Care – PPO

## 2019-12-21 DIAGNOSIS — Z20822 Contact with and (suspected) exposure to covid-19: Secondary | ICD-10-CM

## 2019-12-23 LAB — SARS-COV-2, NAA 2 DAY TAT

## 2019-12-23 LAB — NOVEL CORONAVIRUS, NAA: SARS-CoV-2, NAA: NOT DETECTED

## 2021-04-24 NOTE — Progress Notes (Deleted)
Eric Lozano [address] [place] Phone: [phone] Subjective:    I'm seeing this patient by the request  of:  Donald Prose, MD  CC: left shin pain   [id number]  Last seen 2020  Eric Lozano is a 51 y.o. male coming in with complaint of left shin pain  Onset-  Location Duration-  Character- Aggravating factors- Reliving factors-  Therapies tried-  Severity-     Past Medical History:  Diagnosis Date   Anemia    As a child    GERD (gastroesophageal reflux disease)    Hemorrhoids    Hiatal hernia    No past surgical history on file. Social History   Socioeconomic History   Marital status: Married    Spouse name: Not on file   Number of children: 3   Years of education: Not on file   Highest education level: Not on file  Occupational History   Occupation: Product manager: [employer]  Tobacco Use   Smoking status: Former   Smokeless tobacco: Never  Substance and Sexual Activity   Alcohol use: Yes    Comment: Occasional    Drug use: No   Sexual activity: Not on file  Other Topics Concern   Not on file  Social History Narrative   Not on file   Social Determinants of Health   Financial Resource Strain: Not on file  Food Insecurity: Not on file  Transportation Needs: Not on file  Physical Activity: Not on file  Stress: Not on file  Social Connections: Not on file   No Known Allergies Family History  Problem Relation Age of Onset   Celiac disease Mother    Celiac disease Brother    Colon cancer Neg Hx    Leukemia Cousin    Lymphoma Cousin    Addison's disease Brother    Throat cancer Father        ? Esophagus Cancer as well    Diabetes Maternal Uncle    Diabetes Paternal Grandfather      Current Outpatient Medications (Cardiovascular):    nitroGLYCERIN (NITRODUR - DOSED IN MG/24 HR) 0.2 mg/hr patch, 1/4 patch daily     Current Outpatient Medications (Other):     Diclofenac Sodium (PENNSAID) 2 % SOLN, Place 2 g onto the skin 2 (two) times daily.   doxycycline (VIBRA-TABS) 100 MG tablet, Take 1 tablet (100 mg total) by mouth 2 (two) times daily.   doxycycline (VIBRA-TABS) 100 MG tablet, Take 1 tablet (100 mg total) by mouth 2 (two) times daily.   hydrocortisone (ANUSOL-HC) 25 MG suppository, Please place one suppositories rectally at bedtime for two weeks   Multiple Vitamins-Minerals (MENS MULTIVITAMIN PLUS PO), Take 1 tablet by mouth daily.   Vitamin D, Ergocalciferol, (DRISDOL) 1.25 MG (50000 UT) CAPS capsule, Take 1 capsule (50,000 Units total) by mouth every 7 (seven) days.   Vitamin D, Ergocalciferol, (DRISDOL) 1.25 MG (50000 UT) CAPS capsule, Take 1 capsule (50,000 Units total) by mouth every 7 (seven) days.   Vitamin D, Ergocalciferol, (DRISDOL) 1.25 MG (50000 UT) CAPS capsule, Take 1 capsule (50,000 Units total) by mouth every 7 (seven) days.   Reviewed prior external information including notes and imaging from  primary care provider As well as notes that were available from care everywhere and other healthcare systems.  Past medical history, social, surgical and family history all reviewed in electronic medical record.  No pertanent information unless stated regarding to  the chief complaint.   Review of Systems:  No headache, visual changes, nausea, vomiting, diarrhea, constipation, dizziness, abdominal pain, skin rash, fevers, chills, night sweats, weight loss, swollen lymph nodes, body aches, joint swelling, chest pain, shortness of breath, mood changes. POSITIVE muscle aches  Objective  There were no vitals taken for this visit.   General: No apparent distress alert and oriented x3 mood and affect normal, dressed appropriately.  HEENT: Pupils equal, extraocular movements intact  Respiratory: Patient's speak in full sentences and does not appear short of breath  Cardiovascular: No lower extremity edema, non tender, no erythema  Gait  normal with good balance and coordination.  MSK:      Impression and Recommendations:     The above documentation has been reviewed and is accurate and complete Lyndal Pulley, DO

## 2021-04-25 ENCOUNTER — Ambulatory Visit: Payer: BC Managed Care – PPO | Admitting: Family Medicine

## 2021-12-18 ENCOUNTER — Ambulatory Visit: Payer: Self-pay

## 2021-12-18 ENCOUNTER — Ambulatory Visit: Payer: BC Managed Care – PPO | Admitting: Family Medicine

## 2021-12-18 ENCOUNTER — Ambulatory Visit (INDEPENDENT_AMBULATORY_CARE_PROVIDER_SITE_OTHER): Payer: BC Managed Care – PPO

## 2021-12-18 VITALS — BP 112/72 | HR 68 | Ht 73.0 in | Wt 224.0 lb

## 2021-12-18 DIAGNOSIS — M79672 Pain in left foot: Secondary | ICD-10-CM | POA: Diagnosis not present

## 2021-12-18 DIAGNOSIS — M6528 Calcific tendinitis, other site: Secondary | ICD-10-CM

## 2021-12-18 MED ORDER — MELOXICAM 15 MG PO TABS: 15.0000 mg | ORAL_TABLET | Freq: Every day | ORAL | 0 refills | Status: AC

## 2021-12-18 MED ORDER — NITROGLYCERIN 0.2 MG/HR TD PT24: MEDICATED_PATCH | TRANSDERMAL | 0 refills | Status: DC

## 2021-12-18 MED ORDER — VITAMIN D (ERGOCALCIFEROL) 1.25 MG (50000 UNIT) PO CAPS: 50000.0000 [IU] | ORAL_CAPSULE | ORAL | 0 refills | Status: DC

## 2021-12-18 NOTE — Patient Instructions (Addendum)
Good to see you! Heel lifts Do prescribed exercises at least 3x a week Ice and Voltaren  Meloxicam take for 10 days and then as needed. Use Voltaren after that Weekly Vit D Avoid being barefoot in the house, consider recovery sandals ([place]oka or Oofos) Xray today  Nitroglycerin Protocol   Apply 1/4 nitroglycerin patch to affected area daily.  Change position of patch within the affected area every 24 hours.  You may experience a headache during the first 1-2 weeks of using the patch, these should subside.  If you experience headaches after beginning nitroglycerin patch treatment, you may take your preferred over the counter pain reliever.  Another side effect of the nitroglycerin patch is skin irritation or rash related to patch adhesive.  Please notify our office if you develop more severe headaches or rash, and stop the patch.  Tendon healing with nitroglycerin patch may require 12 to 24 weeks depending on the extent of injury.  Men should not use if taking Viagra, Cialis, or Levitra.   Do not use if you have migraines or rosacea.

## 2021-12-18 NOTE — Assessment & Plan Note (Addendum)
Chronic problem with exacerbation.  Started on once weekly vitamin D, we discussed recovery sandals, heel lift given.  Short course of meloxicam.  Warned of potential side effects.  Discussed topical anti-inflammatories.  Did start the nitroglycerin patches which patient has done previously and warned of potential side effects.  Work with Product/process development scientist to learn home exercises.  We will get x-rays to further evaluate the amount of calcific changes also noted.  Follow-up with me again in 6 to 8 weeks otherwise.

## 2021-12-18 NOTE — Progress Notes (Signed)
Zach Kriti Katayama [place] [address] [place] Phone: [phone] Subjective:   IVilma Meckel, am serving as a scribe for Dr. Hulan Saas.  I'm seeing this patient by the request  of:  Sun, Vyvyan, MD  CC: left calf pain follow up   [id number]  Last seen in 2020 for shoulder pain  Eric Lozano is a 51 y.o. male coming in with complaint of left calf pain. Pain is lateral side of heel and calf. Sharp in nature in the AM and dissipates as the day goes on. Will become more apparent if sitting for more than 15 mins and then trying to walk. Plantarflexion can reproduce pain.       Past Medical History:  Diagnosis Date   Anemia    As a child    GERD (gastroesophageal reflux disease)    Hemorrhoids    Hiatal hernia    No past surgical history on file. Social History   Socioeconomic History   Marital status: Married    Spouse name: Not on file   Number of children: 3   Years of education: Not on file   Highest education level: Not on file  Occupational History   Occupation: Product manager: [employer]  Tobacco Use   Smoking status: Former   Smokeless tobacco: Never  Substance and Sexual Activity   Alcohol use: Yes    Comment: Occasional    Drug use: No   Sexual activity: Not on file  Other Topics Concern   Not on file  Social History Narrative   Not on file   Social Determinants of Health   Financial Resource Strain: Not on file  Food Insecurity: Not on file  Transportation Needs: Not on file  Physical Activity: Not on file  Stress: Not on file  Social Connections: Not on file   No Known Allergies Family History  Problem Relation Age of Onset   Celiac disease Mother    Celiac disease Brother    Colon cancer Neg Hx    Leukemia Cousin    Lymphoma Cousin    Addison's disease Brother    Throat cancer Father        ? Esophagus Cancer as well    Diabetes Maternal Uncle    Diabetes Paternal  Grandfather      Current Outpatient Medications (Cardiovascular):    nitroGLYCERIN (NITRODUR - DOSED IN MG/24 HR) 0.2 mg/hr patch, 1/4 patch daily   Current Outpatient Medications (Analgesics):    meloxicam (MOBIC) 15 MG tablet, Take 1 tablet (15 mg total) by mouth daily.   Current Outpatient Medications (Other):    Vitamin D, Ergocalciferol, (DRISDOL) 1.25 MG (50000 UNIT) CAPS capsule, Take 1 capsule (50,000 Units total) by mouth every 7 (seven) days.   Diclofenac Sodium (PENNSAID) 2 % SOLN, Place 2 g onto the skin 2 (two) times daily.   doxycycline (VIBRA-TABS) 100 MG tablet, Take 1 tablet (100 mg total) by mouth 2 (two) times daily.   doxycycline (VIBRA-TABS) 100 MG tablet, Take 1 tablet (100 mg total) by mouth 2 (two) times daily.   hydrocortisone (ANUSOL-HC) 25 MG suppository, Please place one suppositories rectally at bedtime for two weeks   Multiple Vitamins-Minerals (MENS MULTIVITAMIN PLUS PO), Take 1 tablet by mouth daily.   Vitamin D, Ergocalciferol, (DRISDOL) 1.25 MG (50000 UT) CAPS capsule, Take 1 capsule (50,000 Units total) by mouth every 7 (seven) days.   Vitamin D, Ergocalciferol, (DRISDOL) 1.25 MG (50000 UT)  CAPS capsule, Take 1 capsule (50,000 Units total) by mouth every 7 (seven) days.   Vitamin D, Ergocalciferol, (DRISDOL) 1.25 MG (50000 UT) CAPS capsule, Take 1 capsule (50,000 Units total) by mouth every 7 (seven) days.   Reviewed prior external information including notes and imaging from  primary care provider As well as notes that were available from care everywhere and other healthcare systems.  Past medical history, social, surgical and family history all reviewed in electronic medical record.  No pertanent information unless stated regarding to the chief complaint.   Review of Systems:  No headache, visual changes, nausea, vomiting, diarrhea, constipation, dizziness, abdominal pain, skin rash, fevers, chills, night sweats, weight loss, swollen lymph nodes,  body aches, joint swelling, chest pain, shortness of breath, mood changes. POSITIVE muscle aches  Objective  Blood pressure 112/72, pulse 68, height '6\' 1"'$  (1.854 m), weight 224 lb (101.6 kg), SpO2 97 %.   General: No apparent distress alert and oriented x3 mood and affect normal, dressed appropriately.  HEENT: Pupils equal, extraocular movements intact  Respiratory: Patient's speak in full sentences and does not appear short of breath  Cardiovascular: No lower extremity edema, non tender, no erythema  Calf exam shows patient does have good range of motion.  Patient does have tenderness to palpation more over the Achilles.  Seems to be at the insertion.  Limited muscular skeletal ultrasound was performed and interpreted by Hulan Saas, M   Changes noted of the tendon.  Increased neovascularization and increased Doppler flow.  No true retraction noted. \Impression: Calcific tendinitis   [id number]; 15 additional minutes spent for Therapeutic exercises as stated in above notes.  This included exercises focusing on stretching, strengthening, with significant focus on eccentric aspects.   Long term goals include an improvement in range of motion, strength, endurance as well as avoiding reinjury. Patient's frequency would include in 1-2 times a day, 3-5 times a week for a duration of 6-12 weeks. Ankle strengthening that included:  Basic range of motion exercises to allow proper full motion at ankle Stretching of the lower leg and hamstrings  Theraband exercises for the lower leg - inversion, eversion, dorsiflexion and plantarflexion each to be completed with a theraband Balance exercises to increase proprioception Weight bearing exercises to increase strength and balance  Proper technique shown and discussed handout in great detail with ATC.  All questions were discussed and answered.     Impression and Recommendations:    The above documentation has been reviewed and is accurate and complete  Lyndal Pulley, DO

## 2022-03-20 ENCOUNTER — Other Ambulatory Visit: Payer: Self-pay | Admitting: Family Medicine

## 2022-03-20 NOTE — Progress Notes (Signed)
[name] Phone: [phone] Subjective:    I'm seeing this patient by the request  of:  Donald Prose, MD  CC: Calf pain  [id number]  12/18/2021 Chronic problem with exacerbation.  Started on once weekly vitamin D, we discussed recovery sandals, heel lift given.  Short course of meloxicam.  Warned of potential side effects.  Discussed topical anti-inflammatories.  Did start the nitroglycerin patches which patient has done previously and warned of potential side effects.  Work with Product/process development scientist to learn home exercises.  We will get x-rays to further evaluate the amount of calcific changes also noted.  Follow-up with me again in 6 to 8 weeks otherwise.     Update 03/28/2022 Eric Lozano is a 51 y.o. male coming in with complaint of L achilles pain.  Patient was seen previously and more calcific Achilles tendinitis.  Started on once weekly vitamin D as well as nitroglycerin patches and meloxicam.  Patient states that after activity his pain is worse after activity. Painful at night and then he is not able to play ultimate. Unable to play 2 days in a row. Has been using patches on a regular basis.   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  This was independently visualized by me.     Past Medical History:  Diagnosis Date   Anemia    As a child    GERD (gastroesophageal reflux disease)    Hemorrhoids    Hiatal hernia    No past surgical history on file. Social History   Socioeconomic History   Marital status: Married    Spouse name: Not on file   Number of children: 3   Years of education: Not on file   Highest education level: Not on file  Occupational History   Occupation: Product manager: [employer]  Tobacco Use   Smoking status: Former   Smokeless tobacco: Never  Substance and Sexual Activity    Alcohol use: Yes    Comment: Occasional    Drug use: No   Sexual activity: Not on file  Other Topics Concern   Not on file  Social History Narrative   Not on file   Social Determinants of Health   Financial Resource Strain: Not on file  Food Insecurity: Not on file  Transportation Needs: Not on file  Physical Activity: Not on file  Stress: Not on file  Social Connections: Not on file   No Known Allergies Family History  Problem Relation Age of Onset   Celiac disease Mother    Celiac disease Brother    Colon cancer Neg Hx    Leukemia Cousin    Lymphoma Cousin    Addison's disease Brother    Throat cancer Father        ? Esophagus Cancer as well    Diabetes Maternal Uncle    Diabetes Paternal Grandfather      Current Outpatient Medications (Cardiovascular):    nitroGLYCERIN (NITRO-DUR) 0.2 mg/hr patch, Apply 1/4 of a patch to skin once daily.   nitroGLYCERIN (NITRODUR - DOSED IN MG/24 HR) 0.2 mg/hr patch, 1/4 patch daily   Current Outpatient Medications (Analgesics):    meloxicam (MOBIC) 15 MG tablet, Take 1 tablet (15 mg total) by mouth daily.   Current Outpatient Medications (Other):    Diclofenac Sodium (PENNSAID) 2 % SOLN, Place 2 g  onto the skin 2 (two) times daily.   doxycycline (VIBRA-TABS) 100 MG tablet, Take 1 tablet (100 mg total) by mouth 2 (two) times daily.   doxycycline (VIBRA-TABS) 100 MG tablet, Take 1 tablet (100 mg total) by mouth 2 (two) times daily.   hydrocortisone (ANUSOL-HC) 25 MG suppository, Please place one suppositories rectally at bedtime for two weeks   Multiple Vitamins-Minerals (MENS MULTIVITAMIN PLUS PO), Take 1 tablet by mouth daily.   Vitamin D, Ergocalciferol, (DRISDOL) 1.25 MG (50000 UNIT) CAPS capsule, TAKE 1 CAPSULE BY MOUTH EVERY 7 DAYS   Vitamin D, Ergocalciferol, (DRISDOL) 1.25 MG (50000 UT) CAPS capsule, Take 1 capsule (50,000 Units total) by mouth every 7 (seven) days.   Vitamin D, Ergocalciferol, (DRISDOL) 1.25 MG (50000 UT)  CAPS capsule, Take 1 capsule (50,000 Units total) by mouth every 7 (seven) days.   Vitamin D, Ergocalciferol, (DRISDOL) 1.25 MG (50000 UT) CAPS capsule, Take 1 capsule (50,000 Units total) by mouth every 7 (seven) days.   Reviewed prior external information including notes and imaging from  primary care provider As well as notes that were available from care everywhere and other healthcare systems.  Past medical history, social, surgical and family history all reviewed in electronic medical record.  No pertanent information unless stated regarding to the chief complaint.   Review of Systems:  No headache, visual changes, nausea, vomiting, diarrhea, constipation, dizziness, abdominal pain, skin rash, fevers, chills, night sweats, weight loss, swollen lymph nodes, body aches, joint swelling, chest pain, shortness of breath, mood changes. POSITIVE muscle aches  Objective  There were no vitals taken for this visit.   General: No apparent distress alert and oriented x3 mood and affect normal, dressed appropriately.  HEENT: Pupils equal, extraocular movements intact  Respiratory: Patient's speak in full sentences and does not appear short of breath  Cardiovascular: No lower extremity edema, non tender, no erythema  Ankle exam shows mild arthritic changes noted.  Patient does have mild tightness of the posterior cord noted.  Limited muscular skeletal ultrasound was performed and interpreted by Hulan Saas, M  Limited ultrasound shows that patient does have significant calcific changes still noted of the distal Achilles.  No significant hypoechoic changes.  No tearing noted at the moment.  Patient's musculature does seem to be dehydrated. Impression: Significant decrease in hypoechoic changes but do still calcific changes of the Achilles    Impression and Recommendations:     The above documentation has been reviewed and is accurate and complete Lyndal Pulley, DO

## 2022-03-28 ENCOUNTER — Encounter: Payer: Self-pay | Admitting: Family Medicine

## 2022-03-28 ENCOUNTER — Ambulatory Visit: Payer: BC Managed Care – PPO | Admitting: Family Medicine

## 2022-03-28 ENCOUNTER — Ambulatory Visit: Payer: Self-pay

## 2022-03-28 VITALS — BP 118/82 | HR 72 | Ht 73.0 in | Wt 224.0 lb

## 2022-03-28 DIAGNOSIS — M6528 Calcific tendinitis, other site: Secondary | ICD-10-CM | POA: Diagnosis not present

## 2022-03-28 DIAGNOSIS — M79662 Pain in left lower leg: Secondary | ICD-10-CM | POA: Diagnosis not present

## 2022-03-28 NOTE — Assessment & Plan Note (Signed)
Significant callus changes still noted.  No significant hypoechoic changes though noted to have elevated.  At this point I would like to start with shockwave therapy to see if it would be helpful.  Discussed which activities to do and which ones to avoid.  Increase activity slowly.  Follow-up again in 6 to 8 weeks.  Continue the nitroglycerin recorder patch as well.

## 2022-03-28 NOTE — Patient Instructions (Signed)
Keeping doing nitro Schedule w Dr. Glennon Mac for shockwave therapy See me in 6-8 weeks

## 2022-04-08 ENCOUNTER — Ambulatory Visit (INDEPENDENT_AMBULATORY_CARE_PROVIDER_SITE_OTHER): Payer: Self-pay | Admitting: Sports Medicine

## 2022-04-08 DIAGNOSIS — M79662 Pain in left lower leg: Secondary | ICD-10-CM

## 2022-04-08 DIAGNOSIS — M7660 Achilles tendinitis, unspecified leg: Secondary | ICD-10-CM

## 2022-04-08 DIAGNOSIS — M6528 Calcific tendinitis, other site: Secondary | ICD-10-CM

## 2022-04-08 DIAGNOSIS — M79672 Pain in left foot: Secondary | ICD-10-CM

## 2022-04-08 NOTE — Progress Notes (Signed)
   [facility] [place] Phone: [phone]   Extracorporeal Shockwave Therapy Note    Patient is being treated today with ECSWT. Informed consent was obtained and patient tolerated procedure well.   Therapy performed by Glennon Mac  Condition treated: Calcaneal spur/Achilles tendinitis Treatment preset used: Heel spur Energy used: 120 mJ Frequency used: 10 hz Number of pulses: 2000 Treatment #1 of #3  Electronically signed by:  [facility] 8:38 AM 04/08/22

## 2022-04-15 ENCOUNTER — Ambulatory Visit (INDEPENDENT_AMBULATORY_CARE_PROVIDER_SITE_OTHER): Payer: Self-pay | Admitting: Sports Medicine

## 2022-04-15 DIAGNOSIS — M79672 Pain in left foot: Secondary | ICD-10-CM

## 2022-04-15 DIAGNOSIS — M6528 Calcific tendinitis, other site: Secondary | ICD-10-CM

## 2022-04-15 DIAGNOSIS — M79662 Pain in left lower leg: Secondary | ICD-10-CM

## 2022-04-15 NOTE — Progress Notes (Signed)
   [facility] [place] Phone: [phone]   Extracorporeal Shockwave Therapy Note    Patient is being treated today with ECSWT. Informed consent was obtained and patient tolerated procedure well.   Therapy performed by Glennon Mac  Condition treated: Calcaneal spur/Achilles tendinitis Treatment preset used: Heel spur Energy used: 120 mJ Frequency used: 10 hz Number of pulses: 2000 Treatment #2 of #3  Electronically signed by:  Jonette [facility] 1:00 PM 04/15/22

## 2022-04-22 ENCOUNTER — Ambulatory Visit (INDEPENDENT_AMBULATORY_CARE_PROVIDER_SITE_OTHER): Payer: Self-pay | Admitting: Sports Medicine

## 2022-04-22 DIAGNOSIS — M6528 Calcific tendinitis, other site: Secondary | ICD-10-CM

## 2022-04-22 DIAGNOSIS — M79672 Pain in left foot: Secondary | ICD-10-CM

## 2022-04-22 DIAGNOSIS — M79662 Pain in left lower leg: Secondary | ICD-10-CM

## 2022-04-22 NOTE — Progress Notes (Signed)
   [facility] [place] Phone: [phone]   Extracorporeal Shockwave Therapy Note    Patient is being treated today with ECSWT. Informed consent was obtained and patient tolerated procedure well.  Patient overall states that he has had mild improvement to no change in symptoms since starting ECSWT.  Plan on patient following up with Dr. Tamala Julian in the next 1 to 2 weeks to discuss if we should continue ECSWT and discuss further treatment plan.  Therapy performed by Glennon Mac  Condition treated: Calcaneal spur/Achilles tendinitis Treatment preset used: Heel spur Energy used: 120 mJ Frequency used: 10 hz Number of pulses: 2000 Treatment #3 of #3  Electronically signed by:  Jonette [facility] 8:25 AM 04/22/22

## 2022-05-01 NOTE — Progress Notes (Signed)
[place] Phone: [phone] Subjective:   Eric Lozano, am serving as a scribe for Dr. Hulan Saas.  I'm seeing this patient by the request  of:  Donald Prose, MD  CC:   [id number]  03/28/2022 Significant callus changes still noted.  Lozano significant hypoechoic changes though noted to have elevated.  At this point I would like to start with shockwave therapy to see if it would be helpful.  Discussed which activities to do and which ones to avoid.  Increase activity slowly.  Follow-up again in 6 to 8 weeks.  Continue the nitroglycerin recorder patch as well.      Update 05/07/2022 Eric Lozano is a 52 y.o. male coming in with complaint of L achilles pain. Patient has been doing shockwave therapy. Patient states that he is improving but still having pain after his games. Less pain at night.       Past Medical History:  Diagnosis Date   Anemia    As a child    GERD (gastroesophageal reflux disease)    Hemorrhoids    Hiatal hernia    Lozano past surgical history on file. Social History   Socioeconomic History   Marital status: Married    Spouse name: Not on file   Number of children: 3   Years of education: Not on file   Highest education level: Not on file  Occupational History   Occupation: Product manager: [employer]  Tobacco Use   Smoking status: Former   Smokeless tobacco: Never  Substance and Sexual Activity   Alcohol use: Yes    Comment: Occasional    Drug use: Lozano   Sexual activity: Not on file  Other Topics Concern   Not on file  Social History Narrative   Not on file   Social Determinants of Health   Financial Resource Strain: Not on file  Food Insecurity: Not on file  Transportation Needs: Not on file  Physical Activity: Not on file  Stress: Not on file  Social Connections: Not on file   Lozano Known Allergies Family History  Problem Relation Age of Onset    Celiac disease Mother    Celiac disease Brother    Colon cancer Neg Hx    Leukemia Cousin    Lymphoma Cousin    Addison's disease Brother    Throat cancer Father        ? Esophagus Cancer as well    Diabetes Maternal Uncle    Diabetes Paternal Grandfather      Current Outpatient Medications (Cardiovascular):    nitroGLYCERIN (NITRODUR - DOSED IN MG/24 HR) 0.2 mg/hr patch, 1/4 patch daily   nitroGLYCERIN (NITRO-DUR) 0.2 mg/hr patch, Apply 1/4 of a patch to skin once daily.   Current Outpatient Medications (Analgesics):    meloxicam (MOBIC) 15 MG tablet, Take 1 tablet (15 mg total) by mouth daily.   Current Outpatient Medications (Other):    Diclofenac Sodium (PENNSAID) 2 % SOLN, Place 2 g onto the skin 2 (two) times daily.   doxycycline (VIBRA-TABS) 100 MG tablet, Take 1 tablet (100 mg total) by mouth 2 (two) times daily.   doxycycline (VIBRA-TABS) 100 MG tablet, Take 1 tablet (100 mg total) by mouth 2 (two) times daily.   hydrocortisone (ANUSOL-HC) 25 MG suppository, Please place one suppositories rectally at bedtime for two weeks   Multiple Vitamins-Minerals (MENS MULTIVITAMIN PLUS PO), Take 1 tablet by mouth daily.  Vitamin D, Ergocalciferol, (DRISDOL) 1.25 MG (50000 UT) CAPS capsule, Take 1 capsule (50,000 Units total) by mouth every 7 (seven) days.   Vitamin D, Ergocalciferol, (DRISDOL) 1.25 MG (50000 UT) CAPS capsule, Take 1 capsule (50,000 Units total) by mouth every 7 (seven) days.   Vitamin D, Ergocalciferol, (DRISDOL) 1.25 MG (50000 UT) CAPS capsule, Take 1 capsule (50,000 Units total) by mouth every 7 (seven) days.   Vitamin D, Ergocalciferol, (DRISDOL) 1.25 MG (50000 UNIT) CAPS capsule, Take 1 capsule (50,000 Units total) by mouth every 7 (seven) days.   Reviewed prior external information including notes and imaging from  primary care provider As well as notes that were available from care everywhere and other healthcare systems.  Past medical history, social,  surgical and family history all reviewed in electronic medical record.  Lozano pertanent information unless stated regarding to the chief complaint.   Review of Systems:  Lozano headache, visual changes, nausea, vomiting, diarrhea, constipation, dizziness, abdominal pain, skin rash, fevers, chills, night sweats, weight loss, swollen lymph nodes, body aches, joint swelling, chest pain, shortness of breath, mood changes. POSITIVE muscle aches  Objective  Blood pressure 104/82, pulse 60, height '6\' 1"'$  (1.854 m), weight 225 lb (102.1 kg), SpO2 100 %.   General: Lozano apparent distress alert and oriented x3 mood and affect normal, dressed appropriately.  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  Left greater than right.  Mild tenderness to palpation but Lozano erythema noted.  Full strength noted.  Limited muscular skeletal ultrasound was performed and interpreted by Hulan Saas, M   Limited chest that shows that patient does have some hypoechoic changes noted.  Calcific changes still noted but significant improved from previous exam.  Measures 1 cm. Skin: Interval improvement of the calcific Achilles tendinosis but still some calcium left.    Impression and Recommendations:    The above documentation has been reviewed and is accurate and complete Lyndal Pulley, DO

## 2022-05-07 ENCOUNTER — Ambulatory Visit (INDEPENDENT_AMBULATORY_CARE_PROVIDER_SITE_OTHER): Payer: BC Managed Care – PPO

## 2022-05-07 ENCOUNTER — Encounter: Payer: Self-pay | Admitting: Family Medicine

## 2022-05-07 ENCOUNTER — Ambulatory Visit: Payer: Self-pay

## 2022-05-07 ENCOUNTER — Ambulatory Visit: Payer: BC Managed Care – PPO | Admitting: Family Medicine

## 2022-05-07 VITALS — BP 104/82 | HR 60 | Ht 73.0 in | Wt 225.0 lb

## 2022-05-07 DIAGNOSIS — M7660 Achilles tendinitis, unspecified leg: Secondary | ICD-10-CM

## 2022-05-07 DIAGNOSIS — G8929 Other chronic pain: Secondary | ICD-10-CM

## 2022-05-07 DIAGNOSIS — M79672 Pain in left foot: Secondary | ICD-10-CM | POA: Diagnosis not present

## 2022-05-07 DIAGNOSIS — M6528 Calcific tendinitis, other site: Secondary | ICD-10-CM | POA: Diagnosis not present

## 2022-05-07 MED ORDER — NITROGLYCERIN 0.2 MG/HR TD PT24: MEDICATED_PATCH | TRANSDERMAL | 0 refills | Status: AC

## 2022-05-07 MED ORDER — VITAMIN D (ERGOCALCIFEROL) 1.25 MG (50000 UNIT) PO CAPS: 50000.0000 [IU] | ORAL_CAPSULE | ORAL | 0 refills | Status: AC

## 2022-05-07 NOTE — Assessment & Plan Note (Signed)
Patient still has calcific changes noted.  Does seem to be about half the size as what it was previously.  Significant decrease in the hypoechoic changes.  At this point I do think that potentially doing another 3 rounds of the shockwave could be potentially beneficial.  We discussed with patient about icing regimen and home exercises, which activities to do and which ones to avoid, increase activity slowly.  We discussed continuing with the home exercises.  Worsening pain I want him to see me sooner otherwise follow-up with me again in 2 months.

## 2022-05-07 NOTE — Patient Instructions (Addendum)
Xray today Nitro and Vit D refilled Schedule shockwave for 3 more rounds Keep being active See you again in 2 months

## 2022-05-13 ENCOUNTER — Ambulatory Visit: Payer: BC Managed Care – PPO | Admitting: Family Medicine

## 2022-05-13 DIAGNOSIS — M79672 Pain in left foot: Secondary | ICD-10-CM

## 2022-05-13 DIAGNOSIS — M6528 Calcific tendinitis, other site: Secondary | ICD-10-CM

## 2022-05-13 DIAGNOSIS — G8929 Other chronic pain: Secondary | ICD-10-CM

## 2022-05-13 NOTE — Progress Notes (Signed)
   [facility] [place] Phone: [phone]   Extracorporeal Shockwave Therapy Note    Patient is being treated today with ECSWT. Informed consent was obtained and patient tolerated procedure well.   Therapy performed by Lynne Leader  Condition treated: Calcific tendinitis left calcaneus Treatment preset used: Heel spur Energy used: 120 mJ Frequency used: 10 Hz Number of pulses: 2000 Treatment #4 of #6  Electronically signed by:  [facility] 8:10 AM 05/13/22

## 2022-05-20 ENCOUNTER — Ambulatory Visit (INDEPENDENT_AMBULATORY_CARE_PROVIDER_SITE_OTHER): Payer: Self-pay | Admitting: Family Medicine

## 2022-05-20 DIAGNOSIS — M6528 Calcific tendinitis, other site: Secondary | ICD-10-CM

## 2022-05-20 NOTE — Patient Instructions (Signed)
Thank you for coming in today.    

## 2022-05-20 NOTE — Progress Notes (Signed)
   [facility] [place] Phone: [phone]   Extracorporeal Shockwave Therapy Note    Patient is being treated today with ECSWT. Informed consent was obtained and patient tolerated procedure well.   Therapy performed by Lynne Leader  Condition treated: Left calcific Achilles tendinitis Treatment preset used: Heel spur Energy used: 120 mJ Frequency used: 10 Hz Number of pulses: 2000 Treatment #2 of #4  Electronically signed by:  [facility] 8:05 AM 05/20/22

## 2022-05-28 ENCOUNTER — Ambulatory Visit (INDEPENDENT_AMBULATORY_CARE_PROVIDER_SITE_OTHER): Payer: Self-pay | Admitting: Family Medicine

## 2022-05-28 DIAGNOSIS — M6528 Calcific tendinitis, other site: Secondary | ICD-10-CM

## 2022-05-28 NOTE — Progress Notes (Signed)
   [facility] [place] Phone: [phone]   Extracorporeal Shockwave Therapy Note    Patient is being treated today with ECSWT. Informed consent was obtained and patient tolerated procedure well.   Therapy performed by Lynne Leader  Condition treated: Left calcific Achilles tendinitis Treatment preset used: Heel spur Energy used: 120 mJ Frequency used: 10 Hz Number of pulses: 2000 Treatment #3 of #3-4  Electronically signed by:  [facility] 8:04 AM 05/28/22

## 2022-07-05 NOTE — Progress Notes (Unsigned)
[facility] [address] [place] [address] Phone: [phone] Subjective:   INadine Counts, am serving as a scribe for Dr. Antoine Primas.  I'm seeing this patient by the request  of:  Deatra James, MD  CC: left heel pain   [id number]  05/07/2022 Patient still has calcific changes noted.  Does seem to be about half the size as what it was previously.  Significant decrease in the hypoechoic changes.  At this point I do think that potentially doing another 3 rounds of the shockwave could be potentially beneficial.  We discussed with patient about icing regimen and home exercises, which activities to do and which ones to avoid, increase activity slowly.  We discussed continuing with the home exercises.  Worsening pain I want him to see me sooner otherwise follow-up with me again in 2 months.      Update 07/08/2022 Kengo Boyajian is a 52 y.o. male coming in with complaint of L heel pain. Calcific tendonitis, did son Patient states heel is doing better. L knee feels a little off. No MOI, just a feeling like something isn't right. No other concerns.     Past Medical History:  Diagnosis Date   Anemia    As a child    GERD (gastroesophageal reflux disease)    Hemorrhoids    Hiatal hernia    No past surgical history on file. Social History   Socioeconomic History   Marital status: Married    Spouse name: Not on file   Number of children: 3   Years of education: Not on file   Highest education level: Not on file  Occupational History   Occupation: Magazine features editor: [employer]  Tobacco Use   Smoking status: Former   Smokeless tobacco: Never  Substance and Sexual Activity   Alcohol use: Yes    Comment: Occasional    Drug use: No   Sexual activity: Not on file  Other Topics Concern   Not on file  Social History Narrative   Not on file   Social Determinants of Health   Financial Resource Strain: Not on file  Food  Insecurity: Not on file  Transportation Needs: Not on file  Physical Activity: Not on file  Stress: Not on file  Social Connections: Not on file   No Known Allergies Family History  Problem Relation Age of Onset   Celiac disease Mother    Celiac disease Brother    Colon cancer Neg Hx    Leukemia Cousin    Lymphoma Cousin    Addison's disease Brother    Throat cancer Father        ? Esophagus Cancer as well    Diabetes Maternal Uncle    Diabetes Paternal Grandfather      Current Outpatient Medications (Cardiovascular):    nitroGLYCERIN (NITRO-DUR) 0.2 mg/hr patch, Apply 1/4 of a patch to skin once daily.   nitroGLYCERIN (NITRODUR - DOSED IN MG/24 HR) 0.2 mg/hr patch, 1/4 patch daily   Current Outpatient Medications (Analgesics):    meloxicam (MOBIC) 15 MG tablet, Take 1 tablet (15 mg total) by mouth daily.   Current Outpatient Medications (Other):    Diclofenac Sodium (PENNSAID) 2 % SOLN, Place 2 g onto the skin 2 (two) times daily.   doxycycline (VIBRA-TABS) 100 MG tablet, Take 1 tablet (100 mg total) by mouth 2 (two) times daily.   doxycycline (VIBRA-TABS) 100 MG tablet, Take 1 tablet (100 mg total)  by mouth 2 (two) times daily.   hydrocortisone (ANUSOL-HC) 25 MG suppository, Please place one suppositories rectally at bedtime for two weeks   Multiple Vitamins-Minerals (MENS MULTIVITAMIN PLUS PO), Take 1 tablet by mouth daily.   Vitamin D, Ergocalciferol, (DRISDOL) 1.25 MG (50000 UNIT) CAPS capsule, Take 1 capsule (50,000 Units total) by mouth every 7 (seven) days.   Vitamin D, Ergocalciferol, (DRISDOL) 1.25 MG (50000 UT) CAPS capsule, Take 1 capsule (50,000 Units total) by mouth every 7 (seven) days.   Vitamin D, Ergocalciferol, (DRISDOL) 1.25 MG (50000 UT) CAPS capsule, Take 1 capsule (50,000 Units total) by mouth every 7 (seven) days.   Vitamin D, Ergocalciferol, (DRISDOL) 1.25 MG (50000 UT) CAPS capsule, Take 1 capsule (50,000 Units total) by mouth every 7 (seven)  days.   Reviewed prior external information including notes and imaging from  primary care provider As well as notes that were available from care everywhere and other healthcare systems.  Past medical history, social, surgical and family history all reviewed in electronic medical record.  No pertanent information unless stated regarding to the chief complaint.   Review of Systems:  No headache, visual changes, nausea, vomiting, diarrhea, constipation, dizziness, abdominal pain, skin rash, fevers, chills, night sweats, weight loss, swollen lymph nodes, body aches, joint swelling, chest pain, shortness of breath, mood changes. POSITIVE muscle aches  Objective  Blood pressure 120/72, pulse 98, height 6\' 1"  (1.854 m), weight 225 lb (102.1 kg), SpO2 (!) 71 %.   General: No apparent distress alert and oriented x3 mood and affect normal, dressed appropriately.  HEENT: Pupils equal, extraocular movements intact  Respiratory: Patient's speak in full sentences and does not appear short of breath  Cardiovascular: No lower extremity edema, non tender, no erythema  Left heel pain is significantly improved.  No significant warmness.  Full strength noted.  Nontender on exam. Patient's left knee no significant discomfort on exam, negative McMurray's.  Good stability of the knee noted otherwise.  Patient is moderately tender to palpation over the patellar tendon.  Patient's ankle does have improvement in range of motion.  5 out of 5 strength noted.   Limited muscular skeletal ultrasound was performed and interpreted by Antoine PrimasSMITH, Mavis Gravelle, M  Limited ultrasound of patient's ankle shows significant decrease in the calcific changes but still has a posterior heel spur noted.  Patient does have mild calcific changes noted of the patella tendon as well. Impression: Calcific patellar tendinitis   Impression and Recommendations:    The above documentation has been reviewed and is accurate and complete Judi SaaZachary M  Deedee Lybarger, DO

## 2022-07-08 ENCOUNTER — Encounter: Payer: Self-pay | Admitting: Family Medicine

## 2022-07-08 ENCOUNTER — Ambulatory Visit: Payer: BC Managed Care – PPO | Admitting: Family Medicine

## 2022-07-08 ENCOUNTER — Other Ambulatory Visit: Payer: Self-pay

## 2022-07-08 VITALS — BP 120/72 | HR 98 | Ht 73.0 in | Wt 225.0 lb

## 2022-07-08 DIAGNOSIS — M6528 Calcific tendinitis, other site: Secondary | ICD-10-CM | POA: Diagnosis not present

## 2022-07-08 DIAGNOSIS — M7652 Patellar tendinitis, left knee: Secondary | ICD-10-CM

## 2022-07-08 DIAGNOSIS — M79672 Pain in left foot: Secondary | ICD-10-CM

## 2022-07-08 NOTE — Assessment & Plan Note (Signed)
Patient does have more improvement noted today. Has less significant hypoechoic changes on previous exam.  Discussed icing regimen and home exercises, which activities to do and which ones to avoid.  Increase activity slowly otherwise.  Discussed posture and ergonomics.  Discussed proper shoes again.  Follow-up again in 2 to 3 months if not completely resolved

## 2022-07-08 NOTE — Assessment & Plan Note (Signed)
Patella tendinitis noted.  Discussed icing regimen and home exercises, discussed which activities to do and which ones to avoid.  Increase activity slowly.  Discussed with a Cho-Pat brace.  Follow-up with me again in 6 to 8 weeks

## 2022-07-08 NOTE — Patient Instructions (Signed)
Do prescribed exercises at least 3x a week  Eric Lozano strap with playing or working out Eric Lozano after activity See you again in 2-3 month
